# Patient Record
Sex: Male | Born: 1955 | Race: White | Marital: Married | Smoking: Former smoker
Health system: Northeastern US, Academic
[De-identification: ages and names within clinical notes are randomized; demographics above are authoritative.]

---

## 2003-11-11 ENCOUNTER — Other Ambulatory Visit: Payer: Self-pay

## 2005-05-31 ENCOUNTER — Other Ambulatory Visit: Payer: Self-pay

## 2009-01-19 ENCOUNTER — Ambulatory Visit
Admit: 2009-01-19 | Discharge: 2009-01-19 | Disposition: A | Discharge status: Home / Self Care | Payer: Self-pay | Source: Ambulatory Visit | Attending: Neurology | Admitting: Neurology

## 2009-05-25 ENCOUNTER — Ambulatory Visit: Payer: Self-pay | Admitting: Neurology

## 2009-05-25 NOTE — Progress Notes (Signed)
 Neurology - [facility] Note   Dear Dr. Adline Mango:     We had the pleasure of seeing your patient, Allen Cook, in the movement   disorders clinic today.  He is a 54 year old.  He is being seen in follow   up for cervical dystonia.  HPI   Allen Cook returned today for follow-up of his cervical dystonia.    He was last injected 4 mos and 1 week ago.      Onset: 7 days  Peak Effect:  He had total pain resolution. He also had almost total   resolution in his tremor.   Side Effect: none, no bleeding, no overweakness, no dysphagia  Duration: no wearing off as before with tightness of R posterior neck,   increasing tremor, no persistent pain.     MEDICATIONS:   Coumadin, lisinopril.      EXAMINATION:   Pain: current 0/10 last week 0/10  Inspection:   Hypertrophy: none  Tremor: He has intermittent, small amplitude chaotic tremor when looking   forward.      Dystonic Position:  Torticollis:  left 45 degrees  Tilt: 5 deg R   Flex/Ex: 5 deg Ext  Shift: He has right lateral shift 0.5 cm   Shoulders: L slight elevation      ROM: Full bilaterally     Impression: Cervical dystonia.      Procedure note: After discussion, we decided to proceed with further   botulinum toxin therapy.  EMG guidance was used to ensure proper   localization.  We attempted to maintain superficial injections.  225 units   were injected as follows:      Right:   Trapezius: 50 divided by 2.   Semispinalis capitis: 50 /2.      Left:   Levator: 50.   Splenius capitus: 50 divided by 2.   Semispinalis capitis, superiorly: 25      He tolerated the procedure well.  Hemostasis was obtained.  I will see him   in follow-up.  Current Meds   Coumadin 5 MG Tablet;; RPT  Lisinopril-Hydrochlorothiazide 20-12.5 MG Tablet;; RPT  Amoxicillin-Pot Clavulanate 875-125 MG Tablet;; RPT  FreeStyle Lite Test Strip;; RPT.  Signature   Electronically signed by: Rosalio Loud  M.D.,PhD; 05/25/2009 3:24 PM   EST.

## 2009-09-23 ENCOUNTER — Ambulatory Visit: Payer: Self-pay | Admitting: Neurology

## 2009-09-23 NOTE — Progress Notes (Signed)
Neurology - [facility] Note   Dear Dr. Adline Mango:     We had the pleasure of seeing your patient, Allen Cook, in the movement   disorders clinic today.  He is a 54 year old.  He is being seen in follow   up for cervical dystonia.  HPI   Allen Cook returned today for follow-up of his cervical dystonia.    He was last injected 4 mos and 0 week ago.      Onset: 7 days  Peak Effect:  Pain: total resolution; Tremor: minor and able to control   better.   Side Effect: none, no bleeding, no overweakness, no dysphagia  Duration: starting to get tight over right lateral neck over the last week   or so      MEDICATIONS:   Coumadin, lisinopril.      EXAMINATION:   Pain: current 0/10 last week 0/10  Inspection:   Hypertrophy: none  Tremor: He has intermittent, small amplitude chaotic tremor when looking   forward.      Dystonic Position:  Torticollis:  left 10 degrees  Tilt: 5 deg R   Flex/Ex: 5 deg Ext  Shift: He has right lateral shift 0.5 cm   Shoulders: L slight elevation      ROM: Full bilaterally     Impression: Cervical dystonia.      Procedure note: After discussion, we decided to proceed with further   botulinum toxin therapy.  EMG guidance was used to ensure proper   localization.  We attempted to maintain superficial injections.  225 units   were injected as follows:      Right:   Trapezius: 50 divided by 2.   Semispinalis capitis: 50 /2.      Left:   Levator: 50.   Splenius capitus: 50 divided by 2.   Semispinalis capitis, superiorly: 25      He tolerated the procedure well.  Hemostasis was obtained.  I will see him   in follow-up.  Current Meds   Lisinopril-Hydrochlorothiazide 20-12.5 MG Tablet;; RPT  Coumadin 5 MG Tablet;; RPT  Amoxicillin-Pot Clavulanate 875-125 MG Tablet;; RPT  FreeStyle Lite Test Strip;; RPT.  Signature   Electronically signed by: Rosalio Loud  M.D.,PhD; 09/23/2009 3:28 PM   EST.

## 2010-01-06 ENCOUNTER — Ambulatory Visit: Payer: Self-pay | Admitting: Neurology

## 2010-01-06 NOTE — Progress Notes (Signed)
 Neurology - [facility] Note   Dear Dr. Adline Mango:     We had the pleasure of seeing your patient, Allen Cook, in the movement   disorders clinic today.  He is a 54 year old.  He is being seen in follow   up for cervical dystonia.  HPI   [name] returned today for follow-up of his cervical dystonia.    He was last injected 3 mos and 2 week ago.      Onset: 10 days  Peak Effect:  Pain: improvement in aching in neck; Tremor: minor and able   to control better.   Side Effect: none, no bleeding, no overweakness, no dysphagia  Duration: starting to get tight over right lateral neck over the last week   or so      MEDICATIONS:   Coumadin, lisinopril.      EXAMINATION:   Pain: current 0/10 last week 0/10  Inspection:   Hypertrophy: none  Tremor: He has intermittent, small amplitude chaotic tremor when looking   forward.      Dystonic Position:  Torticollis:  left 10 degrees  Tilt: 5 deg R   Flex/Ex: 5 deg Ext  Shift: He has right lateral shift 1.0 cm   Shoulders: level     ROM: slight decrease to left     Impression: Cervical dystonia.      Procedure note: After discussion, we decided to proceed with further   botulinum toxin therapy.  EMG guidance was used to ensure proper   localization.  We attempted to maintain superficial injections.  225 units   were injected as follows:      Right:   Trapezius: 50 divided by 2.   Semispinalis capitis: 50 /2.      Left:   Levator: 50.   Splenius capitus: 50 divided by 2.   Semispinalis capitis, superiorly: 25      He tolerated the procedure well.  Hemostasis was obtained.  I will see him   in follow-up.  Current Meds   Lisinopril-Hydrochlorothiazide 20-12.5 MG Tablet;; RPT  Coumadin 5 MG Tablet;; RPT  Amoxicillin-Pot Clavulanate 875-125 MG Tablet;; RPT  FreeStyle Lite Test Strip;; RPT.  Signature   Electronically signed by: Rosalio Loud  M.D.,PhD; 01/06/2010 4:41 PM   EST.

## 2010-05-10 ENCOUNTER — Encounter: Payer: Self-pay | Admitting: Neurology

## 2010-05-10 ENCOUNTER — Ambulatory Visit: Payer: Self-pay | Admitting: Neurology

## 2010-05-10 NOTE — Progress Notes (Signed)
 Neurology - [facility] Note   Dear Dr. Adline Mango:     We had the pleasure of seeing your patient, Amyr Klopf, in the movement   disorders clinic today.  He is a 55 year old.  He is being seen in follow   up for cervical dystonia.  HPI   Allen Cook returned today for follow-up of his cervical dystonia.    He was last injected 4 mos and 0 week ago.      Onset: 10 days  Peak Effect:  did not work as well as it had in the past.   Side Effect: none, no bleeding, no overweakness, no dysphagia  Duration: effect over last month has been very decreased      MEDICATIONS:   Coumadin, lisinopril.      EXAMINATION:   Pain: current 0/10 last week 0/10  He feels tightness (achiness) bilaterally along paraspinal muscles of   inferior neck   Inspection:   Hypertrophy: none  Tremor: He has intermittent, small amplitude chaotic tremor when looking   forward.      Dystonic Position:  Torticollis:  left 10 degrees  Tilt: none   Flex/Ex: 5 deg Ext  Shift: He has right lateral shift 1.0 cm   Shoulders: level     ROM: R 60 deg, L 70 deg but both painful.     Impression: Cervical dystonia.      Procedure note: After discussion, we decided to proceed with further   botulinum toxin therapy.  EMG guidance was used to ensure proper   localization.  We switched back to the 26G 37mm needle (last time we   attempted a 30G, 25 mm needle but he felt he did not have the same effect)    225 units were injected as follows:      Right:   Trapezius: 25   Levator 25.   Semispinalis capitis: 50 /2.      Left:   Levator: 50.   Splenius capitus: 50 divided by 2.   Semispinalis capitis, superiorly: 25      He tolerated the procedure well.  Hemostasis was obtained.  I will see him   in follow-up.  Current Meds   Coumadin 5 MG Tablet;; RPT  Lisinopril-Hydrochlorothiazide 20-12.5 MG Tablet;; RPT  Amoxicillin-Pot Clavulanate 875-125 MG Tablet;; RPT  FreeStyle Lite Test Strip;; RPT.  Signature   Electronically signed by: Rosalio Loud  M.D.,PhD;  05/10/2010 3:46 PM   EST.

## 2010-09-06 ENCOUNTER — Ambulatory Visit: Payer: Self-pay | Admitting: Neurology

## 2010-09-06 DIAGNOSIS — G243 Spasmodic torticollis: Secondary | ICD-10-CM

## 2010-09-06 NOTE — Progress Notes (Signed)
 Allen Cook returned today for follow-up of his cervical dystonia.   He was last injected 4 mos and 0 week ago.     Onset: 10 days   Peak Effect: very well: stopped pain but tremor continued.   Side Effect:  no bleeding, no overweakness, no dysphagia   Duration: effect over last month has been very decreased     MEDICATIONS:   Coumadin, lisinopril.     EXAMINATION:   Pain: current 1/10    He feels  Stiff bilaterally along paraspinal muscles of inferior neck , relieved by letting head tilt back , slightly to R  Inspection:   Hypertrophy: none   Tremor: He has intermittent, small amplitude chaotic tremor when looking forward.     Dystonic Position:   Torticollis: left 10 degrees   Tilt: none   Flex/Ex:none  Shift: He has right lateral shift 1.0 cm   Shoulders: mild L elevation    ROM: R 60 deg,.     Impression: Cervical dystonia.     Procedure note: After discussion, we decided to proceed with further   botulinum toxin therapy. EMG guidance was used to ensure proper   localization. We switched back to the 26G 37mm needle (last time we attempted a 30G, 25 mm needle but he felt he did not have the same effect)   225 units were injected as follows:     Right:   Trapezius: 25   Levator 25.   Semispinalis capitis: 50 /2.     Left:   Levator: 50.   Splenius capitus: 50 divided by 2.   Semispinalis capitis, superiorly: 25     He tolerated the procedure well. Hemostasis was obtained. I will see him in follow-up.eg

## 2011-01-05 ENCOUNTER — Ambulatory Visit: Payer: Self-pay | Admitting: Neurology

## 2011-01-10 ENCOUNTER — Encounter: Payer: Self-pay | Admitting: Neurology

## 2011-01-10 ENCOUNTER — Ambulatory Visit: Payer: Self-pay | Admitting: Neurology

## 2011-01-10 DIAGNOSIS — I10 Essential (primary) hypertension: Secondary | ICD-10-CM | POA: Insufficient documentation

## 2011-01-10 DIAGNOSIS — G243 Spasmodic torticollis: Secondary | ICD-10-CM | POA: Insufficient documentation

## 2011-01-10 DIAGNOSIS — I82409 Acute embolism and thrombosis of unspecified deep veins of unspecified lower extremity: Secondary | ICD-10-CM | POA: Insufficient documentation

## 2011-01-10 NOTE — Patient Instructions (Signed)
You have just received botulinum toxin injections.  While these are generally well tolerated, you may experience bruising, or soreness.  Should you develop any significant bleeding or bruising, or pain, please do not hesitate to contact the office.  Likewise, if you experience any significant weakness, difficulty swallowing or difficulty breathing, or double vision please contact the office immediately and if severe, do not hesitate to call 911.

## 2011-01-10 NOTE — Progress Notes (Signed)
Mr. Allen Cook returned today for follow-up of his cervical dystonia.   He was last injected 4 mos and 0 week ago.     Onset: 1 wk  Peak Effect: very well: stopped pain but tremor continued.   Side Effect:  no bleeding, no overweakness, no dysphagia   Duration: effect over last month has been very decreased     MEDICATIONS:   Coumadin, lisinopril.     EXAMINATION:   Pain: current 0/10    He feels  Stiff bilaterally along paraspinal muscles of inferior neck , relieved by letting head tilt back , slightly to R  Inspection:   Hypertrophy: none   Tremor: He has persistent, small amplitude chaotic tremor when looking forward.     Dystonic Position:   Torticollis: left 5 degrees   Tilt: none   Flex/Ex:5 EX  Shift: He has right lateral shift 1.0 cm   Shoulders: level    ROM: R 60 deg,.  Time in Mid P: >60 sec     Impression: Cervical dystonia.     Procedure note: After discussion, we decided to proceed with further   botulinum toxin therapy. EMG guidance was used to ensure proper   localization.  26G 37mm needle   225 units were injected as follows:     Right:   Trapezius: 25   Levator 25.   Semispinalis capitis: 50 /2.     Left:   Levator: 50.   Splenius capitus: 50 divided by 2.   Semispinalis capitis, superiorly: 25     He tolerated the procedure well. Hemostasis was obtained. I will see him in follow-up.eg

## 2011-05-11 ENCOUNTER — Ambulatory Visit: Payer: Self-pay | Admitting: Neurology

## 2011-07-25 ENCOUNTER — Ambulatory Visit: Payer: Self-pay | Admitting: Neurology

## 2011-08-01 ENCOUNTER — Ambulatory Visit: Payer: Self-pay | Admitting: Neurology

## 2011-08-04 ENCOUNTER — Ambulatory Visit: Payer: Self-pay | Admitting: Neurology

## 2011-08-04 ENCOUNTER — Encounter: Payer: Self-pay | Admitting: Neurology

## 2011-08-04 DIAGNOSIS — G243 Spasmodic torticollis: Secondary | ICD-10-CM

## 2011-08-04 NOTE — Progress Notes (Signed)
Allen Cook returned today for follow-up of his cervical dystonia.   He was last injected 6 mos and 3 week ago.     Onset: 1 wk  Peak Effect: very well: stopped pain. Has felt excellent. Also attributes some benefit to increase exercise  Side Effect:  no bleeding, no overweakness, no dysphagia   Duration: effect over last month has been very decreased     MEDICATIONS:   Coumadin, lisinopril.     EXAMINATION:   Pain: current 0/10    He feels  Stiff bilaterally along paraspinal muscles of inferior neck , relieved by letting head tilt back , slightly to R  Inspection:   Hypertrophy: none   Tremor: He has persistent, small amplitude chaotic tremor when looking forward.     Dystonic Position:   Torticollis: left 15 degrees   Tilt: 5 R   Flex/Ex:15 EX  Shift: He has right lateral shift 1.0 cm   Shoulders: level    ROM: R 60 deg,.  Time in Mid P: >60 sec     Impression: Cervical dystonia.     Procedure note: After discussion, we decided to proceed with further   botulinum toxin therapy. EMG guidance was used to ensure proper   localization.  26G 37mm needle   225 units were injected as follows:     Right:   Trapezius: 25   Levator 25.   Semispinalis capitis: 50 /2.     Left:   Levator: 50.   Splenius capitus: 50 divided by 2.   Semispinalis capitis, superiorly: 25     He tolerated the procedure well. Hemostasis was obtained. I will see him in follow-up.

## 2012-02-01 ENCOUNTER — Ambulatory Visit: Payer: Self-pay | Admitting: Neurology

## 2012-02-01 ENCOUNTER — Encounter: Payer: Self-pay | Admitting: Neurology

## 2012-02-01 VITALS — BP 138/82 | HR 75 | Ht 71.0 in | Wt 248.0 lb

## 2012-02-01 DIAGNOSIS — G243 Spasmodic torticollis: Secondary | ICD-10-CM

## 2012-02-01 NOTE — Progress Notes (Signed)
Mr. Allen Cook returned today for follow-up of his cervical dystonia.   He was last injected 6 mos and 0 week ago.     Onset: 1 wk  Peak Effect: very well: stopped pain. Has felt excellent. Also attributes some benefit to increase exercise  Side Effect:  no bleeding, no overweakness, no dysphagia   Duration: mildly stiff, last month  Better when does exercises    MEDICATIONS:   Coumadin, lisinopril.     EXAMINATION:   Pain: current 0/10    He feels  Stiff bilaterally along paraspinal muscles of inferior neck , relieved by letting head tilt back , slightly to R  Inspection:   Hypertrophy: none   Tremor: intermittent, fast jerks, horizontal, small amplitude    Dystonic Position:   Torticollis: left 15 degrees   Tilt:  5 R   Flex/Ex:10 EX  Shift: He has right lateral shift 1.0 cm   Shoulders: level    ROM: R 60 deg,.  Time in Mid P: >60 sec     Impression: Cervical dystonia.     Procedure note: After discussion, we decided to proceed with further   botulinum toxin therapy. EMG guidance was used to ensure proper   localization.  26G 37mm needle   225 units were injected as follows:     Right:   Trapezius:   25   Levator   25.   Semispinalis capitis: 50 /2.     Left:   Levator:   50.   Splenius capitus:  50/ 2.   Semispin capitis,    superiorly:  25     He tolerated the procedure well. Hemostasis was obtained. I will see him in follow-up.

## 2012-08-13 ENCOUNTER — Encounter: Payer: Self-pay | Admitting: Neurology

## 2012-08-13 ENCOUNTER — Ambulatory Visit: Payer: Self-pay | Admitting: Neurology

## 2012-08-13 VITALS — BP 152/90 | HR 75 | Ht 71.0 in | Wt 252.0 lb

## 2012-08-13 DIAGNOSIS — G243 Spasmodic torticollis: Secondary | ICD-10-CM

## 2012-08-13 NOTE — Progress Notes (Signed)
Mr. Sosnoski returned today for follow-up of his cervical dystonia.   He was last injected 6 mos and 0 week ago.     Onset: 1 wk  Peak Effect: good, some problems , occasional, not bad- tightness while driving  Side Effect:  no bleeding, no overweakness, no dysphagia   Duration: doing well, mild symptomatic    MEDICATIONS:   Coumadin, lisinopril.     EXAMINATION:   Pain: current 0/10    He feels  Stiff bilaterally along paraspinal muscles of inferior neck , relieved by letting head tilt back , slightly to R  Inspection:   Hypertrophy: none   Tremor: intermittent, fast jerks, horizontal, small amplitude    Dystonic Position:   Torticollis: left 20 degrees   Tilt:  0  Flex/Ex:10 EX  Shift: He has right lateral shift 2.5 cm   Shoulders: level    ROM: R 60 deg,.  Time in Mid P:unable  Impression: Cervical dystonia.     Procedure note: After discussion, we decided to proceed with further   botulinum toxin therapy. EMG guidance was used to ensure proper   localization.  26G 37mm needle   225 units were injected as follows:     Right:   Trapezius:   25   Levator   25.   Semispinalis capitis: 50 /2.     Left:   Levator:   50.   Splenius capitus:  50/ 2.   Semispin capitis,    superiorly:  25     He tolerated the procedure well. Hemostasis was obtained. I will see him in follow-up.

## 2013-02-13 ENCOUNTER — Encounter: Payer: Self-pay | Admitting: Neurology

## 2013-02-13 ENCOUNTER — Ambulatory Visit: Payer: Self-pay | Admitting: Neurology

## 2013-02-13 VITALS — BP 148/92 | HR 84 | Ht 71.0 in | Wt 250.0 lb

## 2013-02-13 DIAGNOSIS — G243 Spasmodic torticollis: Secondary | ICD-10-CM

## 2013-02-13 NOTE — Progress Notes (Signed)
Allen Cook returned today for follow-up of his cervical dystonia.     He was last injected 6 mos and 0 week ago with good response.    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    EXAMINATION:   BP 148/92  Pulse 84  Ht 1.803 m (5\' 11" )  Wt 113.399 kg (250 lb)  BMI 34.88 kg/m2  Pain: mild posterior neck pain  Hypertrophy: none   Tremor: intermittent, high frequency, low amplitude    Dystonic Position:   Torticollis: left 20 degrees   Tilt: none  Flex/Ex:10 EX  Shift: right lateral shift  Shoulders: left elevation and anterior displacement    ROM: R about 60 deg with lateral flexion    Impression: Cervical dystonia.     Procedure note: After discussion, we decided to proceed with further botulinum toxin therapy. EMG guidance was used to ensure proper localization.  26G 37mm needle.  Given his complaints of left shoulder elevation/pain we adjusted the distribution of injections.  225 units were injected as follows:     Right:    02/13/13 08/13/12   Trapezius:    50/2  25   Levator    0  25.   Semispinalis capitis:  0  50 /2.   Semispinalis cervicis  25  0    Left:   Levator:    75/2  50.   Splenius capitus:  50/2  50/2.   Semispin capitis,    superiorly:   25  25     He tolerated the procedure well. Hemostasis was obtained. I will see him in follow-up in about 6 months.    Vicenta Aly, MD,PHD

## 2013-08-05 ENCOUNTER — Ambulatory Visit: Payer: Self-pay | Admitting: Neurology

## 2013-08-05 ENCOUNTER — Encounter: Payer: Self-pay | Admitting: Neurology

## 2013-08-05 VITALS — BP 147/87 | HR 91 | Ht 71.5 in | Wt 253.0 lb

## 2013-08-05 DIAGNOSIS — G243 Spasmodic torticollis: Secondary | ICD-10-CM

## 2013-08-05 NOTE — Progress Notes (Signed)
Mr. Allen Cook returned today for follow-up of his cervical dystonia.     He was last injected 6 mos and 0 week ago with good response.    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    EXAMINATION:   BP 147/87    Pulse 91    Ht 1.816 m (5' 11.5")    Wt 114.76 kg (253 lb)    BMI 34.80 kg/m2        Pain: mild posterior neck pain  Hypertrophy: right SCM   Tremor: none  Dystonic Position:   Torticollis: left 20 degrees   Tilt: none  Flex/Ex:10 EX  Shift: right lateral shift  Shoulders: 2+ left elevation and anterior displacement    ROM: R about 60 deg with lateral flexion    Impression: Cervical dystonia.     Procedure note: After discussion, we decided to proceed with further botulinum toxin therapy. EMG guidance was used to ensure proper localization.  26G 37mm needle.  Given his complaints of left shoulder elevation/pain we adjusted the distribution of injections.  275 units were injected as follows:     Right:    5/12  02/13/13 08/13/12   Trapezius:    50/2  50/2  25   Levator    0  0  25.   Semispinalis capitis:  0  0  50 /2.   Semispinalis cervicis  25  25  0    Left:   Levator:    75/3  75/2  50.   Splenius capitus:  50/2  50/2  50/2.   Semispin capitis,    superiorly:   50/2  25  25    Trapezius   25  X  X     25 d/c as end of vial.  Total 300  He tolerated the procedure well. Hemostasis was obtained. I will see him in follow-up in about 6.5 months.    Allen AlyICHARD L Demiyah Fischbach, MD,PHD

## 2013-08-12 ENCOUNTER — Ambulatory Visit: Payer: Self-pay | Admitting: Neurology

## 2014-02-26 ENCOUNTER — Encounter: Payer: Self-pay | Admitting: Neurology

## 2014-02-26 ENCOUNTER — Ambulatory Visit: Payer: Self-pay | Admitting: Neurology

## 2014-02-26 VITALS — BP 159/91 | HR 89 | Ht 71.5 in | Wt 247.0 lb

## 2014-02-26 DIAGNOSIS — G243 Spasmodic torticollis: Secondary | ICD-10-CM

## 2014-02-26 MED ORDER — ONABOTULINUMTOXINA 100 UNIT IJ SOLR *I*
300.0000 [IU] | Freq: Once | INTRAMUSCULAR | Status: AC
Start: 2014-02-26 — End: 2014-02-26
  Administered 2014-02-26: 300 [IU] via INTRAMUSCULAR

## 2014-02-26 NOTE — Progress Notes (Signed)
Mr. Vale HavenDavey returned today for follow-up of his cervical dystonia.     He was last injected 6 mos and 3 weeks ago on 08/05/13 with very good response. He says injections lasted longer and he had improvement in left shoulder elevation in pain (injections had been altered given this complaint--additional 25 units to left semispinalis capitis superiorly).    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    EXAMINATION:   BP 159/91 mmHg   Pulse 89   Ht 1.816 m (5' 11.5")   Wt 112.038 kg (247 lb)   BMI 33.97 kg/m2     Pain: mild posterior neck pain  Hypertrophy: right SCM   Tremor: mild irregular yes-yes  Dystonic Position:   Torticollis: left 20 degrees   Tilt: none  Flex/Ex:10 EX  Shift: mild right lateral shift  Shoulders: 2+ left elevation and anterior displacement    ROM: R about 60 deg with lateral flexion, good rotation    Impression: Cervical dystonia.     Procedure note: After discussion, we decided to proceed with further botulinum toxin therapy.    A total of 300 units of ONAbotulinum toxin A (BOTOX) was reconstituted.    EMG guidance was used to ensure proper localization.  26G 37mm needle.     275 units were injected as follows:      02/26/14 08/05/13 02/13/13 08/13/12   RIGHT       Trapezius 50/2 50/2 50/2 25   Levator 0 0 0 25   Semispinalis capitis 0 0 0 50/2   Semispinalis cervicis 25 25 25  0          LEFT       Levator 75/3 75/3 75/2 50   Splenius capitis 50/2 50/2 50/2 50/2   Semispinalis capitis, superiorly 50/2 50/2 25 25    Trapezius 25 25 0 0     25 units were discarded as unavoidable waste. Total 300 units.    He tolerated the procedure well. Hemostasis was obtained.    Injections were performed by Dr. Pernell DupreAdams under the direct supervision  of Dr. Satira MccallumBarbano.    He will return for follow-up in about 6.5 months for consideration of repeat injections.    The patient was seen and evaluated by me with the fellow.  The plan of care was developed with me and approved by me.  All injections were either performed by me personally or under my direct supervision.

## 2014-07-29 ENCOUNTER — Telehealth: Payer: Self-pay

## 2014-07-29 NOTE — Telephone Encounter (Signed)
PER LEE AT [facility] ([phone]), NO AUTHORIZATION NEEDED FOR BOTOX UNDER PT'S PLAN.

## 2014-08-25 ENCOUNTER — Ambulatory Visit: Payer: Self-pay | Admitting: Neurology

## 2014-09-15 ENCOUNTER — Ambulatory Visit: Payer: Self-pay | Admitting: Neurology

## 2014-09-15 ENCOUNTER — Encounter: Payer: Self-pay | Admitting: Neurology

## 2014-09-15 VITALS — BP 140/82 | HR 91 | Ht 71.0 in | Wt 252.0 lb

## 2014-09-15 DIAGNOSIS — G243 Spasmodic torticollis: Secondary | ICD-10-CM

## 2014-09-15 MED ORDER — ONABOTULINUMTOXINA 100 UNIT IJ SOLR *I*
300.0000 [IU] | Freq: Once | INTRAMUSCULAR | Status: AC
Start: 2014-09-15 — End: 2014-09-15
  Administered 2014-09-15: 300 [IU] via INTRAMUSCULAR

## 2014-09-15 NOTE — Progress Notes (Signed)
Mr. Allen Cook returned today for follow-up of his cervical dystonia.     He was last injected about  6 months and 2 weeks ago on 02/26/14.  The injections went very well.  He feels like the injections are lasting longer.    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    EXAMINATION:   Visit Vitals    BP 140/82 (BP Location: Left arm, Patient Position: Sitting, Cuff Size: large adult)    Pulse 91    Ht 1.803 m (5\' 11" )    Wt 114.3 kg (252 lb)    BMI 35.15 kg/m2      Pain: mild posterior right neck pain  Hypertrophy: right SCM   Tremor: very mild irregular yes-yes  Dystonic Position:   Torticollis: left 20 degrees   Tilt: none  Flex/Ex:10 EX  Shift: mild right lateral shift  Shoulders: 2+ left elevation and anterior displacement    ROM: good rotation    Impression: Cervical dystonia with excellent response to botulinum toxin injections.  After discussion, we decided to proceed with botulinum toxin injections.  He will return for follow-up in about 6.5 months for consideration of repeat injections.    Procedure note: After discussion, we decided to proceed with further botulinum toxin therapy.    A total of 300 units of ONAbotulinum toxin A (BOTOX) was freshly reconstituted.    EMG guidance was used to ensure proper localization.  26G 86mm needle.     275 units were injected as follows:      09/15/14 02/26/14 08/05/13 02/13/13 08/13/12   RIGHT        Trapezius 50/2 50/2 50/2 50/2 25   Levator 0 0 0 0 25   Semispinalis capitis 0 0 0 0 50/2   Semispinalis cervicis 25 25 25 25  0           LEFT        Levator 75/3 75/3 75/3 75/2 50   Splenius capitis 50/2 50/2 50/2 50/2 50/2   Semispinalis capitis, superiorly 50/2 50/2 50/2 25 25    Trapezius 25 25 25  0 0      25 units were discarded as unavoidable waste. Total 300 units.    He tolerated the procedure well. Hemostasis was obtained.    Injections were performed under the direct supervision of attending physician, Dr. Rosalio Loud.    Duanne Guess, MD  Movement Disorders Fellow

## 2015-02-23 ENCOUNTER — Ambulatory Visit: Payer: Self-pay | Admitting: Neurology

## 2015-02-23 ENCOUNTER — Encounter: Payer: Self-pay | Admitting: Neurology

## 2015-02-23 VITALS — BP 154/89 | HR 93 | Ht 71.0 in | Wt 249.0 lb

## 2015-02-23 DIAGNOSIS — G243 Spasmodic torticollis: Secondary | ICD-10-CM

## 2015-02-23 MED ORDER — ONABOTULINUMTOXINA 100 UNIT IJ SOLR *I*
300.0000 [IU] | Freq: Once | INTRAMUSCULAR | Status: AC
Start: 2015-02-23 — End: 2015-02-23
  Administered 2015-02-23: 300 [IU] via INTRAMUSCULAR

## 2015-02-23 NOTE — Progress Notes (Signed)
Mr. Vale HavenDavey returned today for follow-up of his cervical dystonia.     He was last injected about  5 months and 1 weeks ago .    The injections went very well.  He feels like the injections are lasting longer.    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    EXAMINATION:   Visit Vitals    BP 154/89 (BP Location: Right arm, Patient Position: Sitting, Cuff Size: adult)    Pulse 93    Ht 1.803 m (5\' 11" )    Wt 112.9 kg (249 lb)    BMI 34.73 kg/m2       Pain: mild posterior right neck pain  Hypertrophy: right SCM   Tremor: very mild irregular yes-yes  Dystonic Position:   Torticollis: left 20 degrees   Tilt: none  Flex/Ex:10 EX  Shift: mild right lateral shift  Shoulders: level    ROM: good rotation    Impression: Cervical dystonia with excellent response to botulinum toxin injections.  After discussion, we decided to proceed with botulinum toxin injections.  He will return for follow-up in about 6.5 months for consideration of repeat injections.    Procedure note: After discussion, we decided to proceed with further botulinum toxin therapy.    A total of 300 units of ONAbotulinum toxin A (BOTOX) was freshly reconstituted.    EMG guidance was used to ensure proper localization.  26G 37mm needle.     275 units were injected as follows:      11/29 09/15/14 02/26/14 08/05/13 02/13/13 08/13/12   RIGHT         Trapezius 50/2 50/2 50/2 50/2 50/2 25   Levator 0 0 0 0 0 25   Semispinalis capitis 0 0 0 0 0 50/2   Semispinalis cervicis 25 25 25 25 25  0            LEFT         Levator 75/3 75/3 75/3 75/3 75/2 50   Splenius capitis 50/2 50/2 50/2 50/2 50/2 50/2   Semispinalis capitis, superiorly 50/2 50/2 50/2 50/2 25 25    Trapezius 25 25 25 25  0 0     25 units were discarded as  unavoidable waste. Total 300 units.    He tolerated the procedure well. Hemostasis was obtained.    Vicenta AlyICHARD L Venesha Petraitis, MD,PHD

## 2015-08-06 ENCOUNTER — Telehealth: Payer: Self-pay

## 2015-08-06 NOTE — Telephone Encounter (Signed)
PER CASSANDRA AT [facility] [phone]), THERE HAS BEEN A CHANGE IN PATIENT'S POLICY & AUTHORIZATION IS NOW NEEDED FOR BOTOX.  FAXED BOTOX AUTHORIZATION REQUEST FORM WITH 09/15/14 & 02/23/15 MD NOTES TO [facility] AT [phone].  MARKED REQUEST URGENT PER CASSANDRA'S INSTRUCTIONS.

## 2015-08-10 ENCOUNTER — Ambulatory Visit: Payer: Self-pay | Admitting: Neurology

## 2015-08-10 ENCOUNTER — Encounter: Payer: Self-pay | Admitting: Neurology

## 2015-08-10 VITALS — BP 145/80 | HR 95 | Ht 71.0 in | Wt 248.0 lb

## 2015-08-10 DIAGNOSIS — G243 Spasmodic torticollis: Secondary | ICD-10-CM

## 2015-08-10 MED ORDER — ONABOTULINUMTOXINA 100 UNIT IJ SOLR *I*
300.0000 [IU] | Freq: Once | INTRAMUSCULAR | Status: AC
Start: 2015-08-10 — End: 2015-08-10
  Administered 2015-08-10: 275 [IU] via INTRAMUSCULAR

## 2015-08-10 NOTE — Progress Notes (Addendum)
Mr. Allen Cook returned today for follow-up of his cervical dystonia.     He was last injected about 5 months and 3 weeks ago.    The injections went very well.  He feels like the injections are lasting longer.    Onset: about 5 days  Peak Effect: very good response,would like a repeat of last  Side Effect:  None, no over-weakness or difficulty swallowing  Duration:  Thinks its still lasting with only mild right sided tightness in back of neck.  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    EXAMINATION:   Visit Vitals    BP 145/80    Pulse 95    Ht 1.803 m (5\' 11" )    Wt 112.5 kg (248 lb)    BMI 34.59 kg/m2     Pain: mild posterior right neck pain  Hypertrophy: right SCM   Tremor: very mild irregular yes-yes  Dystonic Position:   Torticollis: left 20 degrees   Tilt: none  Flex/Ex:10 EX  Shift: mild right lateral shift  Shoulders: level    ROM: good rotation    Impression: Cervical dystonia with excellent response to botulinum toxin injections.  After discussion, we decided to proceed with botulinum toxin injections.  He will return for follow-up in about 5 months for consideration of repeat injections.  [place] planning [place] retiring this year [place]     Procedure note: After discussion, we decided to proceed with further botulinum toxin therapy.    A total of 300 units of ONAbotulinum toxin A (BOTOX) was freshly reconstituted.    EMG guidance was used to ensure proper localization.  26G 37mm needle.     275 units were injected as follows:      08/10/15 11/29 09/15/14 02/26/14 08/05/13 02/13/13 08/13/12   RIGHT          Trapezius 50/2 50/2 50/2 50/2 50/2 50/2 25   Levator 0 0 0 0 0 0 25   Semispinalis capitis 0 0 0 0 0 0 50/2   Semispinalis cervicis 25 25 25 25 25 25  0             LEFT          Levator 75/3 75/3 75/3 75/3 75/3 75/2 50   Splenius capitis 50/2 50/2 50/2 50/2 50/2 50/2 50/2   Semispinalis capitis, superiorly 50/2 50/2 50/2 50/2 50/2 25 25     Trapezius 25 25 25 25 25  0 0     25 units were discarded as unavoidable waste.     He tolerated the procedure well. Hemostasis was obtained.    The injections were done by Dr Jon BillingsMorrison, supervised    Allen ArthursPeter Morrison, DO  Movement Disorders Fellow  The patient was seen and evaluated by me with the fellow.  The plan of care was developed with me and approved by me.  All injections were either performed by me personally or under my direct supervision.  Allen AlyICHARD L Allen Dike, MD,PHD

## 2015-12-16 ENCOUNTER — Telehealth: Payer: Self-pay

## 2015-12-16 NOTE — Telephone Encounter (Signed)
CALLED [facility] [phone]) RE BOTOX AUTHORIZATION FOR 12/28/15 APPT.  SPOKE WITH TAMMY WHO SAID AUTH ON FILE, AUTH [id number], EFFECTIVE 08/11/15-08/10/16, 4 VISITS.

## 2015-12-28 ENCOUNTER — Ambulatory Visit: Payer: Self-pay | Admitting: Neurology

## 2015-12-28 ENCOUNTER — Encounter: Payer: Self-pay | Admitting: Neurology

## 2015-12-28 VITALS — BP 157/83 | HR 78 | Ht 71.0 in | Wt 248.0 lb

## 2015-12-28 DIAGNOSIS — G243 Spasmodic torticollis: Secondary | ICD-10-CM

## 2015-12-28 MED ORDER — ONABOTULINUMTOXINA 100 UNIT IJ SOLR *I*
300.0000 [IU] | Freq: Once | INTRAMUSCULAR | Status: AC
Start: 2015-12-28 — End: 2015-12-28
  Administered 2015-12-28: 275 [IU] via INTRAMUSCULAR

## 2015-12-28 NOTE — Progress Notes (Signed)
Mr. Allen Cook returned today for follow-up of his Cervical Dystonia.     He was last injected about 4 months and 3 weeks ago.    The injections went very well. He denies adverse effects such as dysphagia or over-weakness. He feels like his left should is getting higher. He continues to feel like the injections are lasting longer.    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    EXAMINATION:   There were no vitals taken for this visit.  Pain: none  Hypertrophy: right SCM   Tremor:  Mild, irregular  Dystonic Position:   Torticollis: left 20 degrees   Tilt: slight rightward  Flex/Ex:10 EX  Shift: mild right lateral shift  Shoulders: mild elevation on the left  Good ROM    Impression: Cervical dystonia with excellent response to botulinum toxin injections.  After discussion, we decided to proceed with botulinum toxin injections. We have made some changes to the disbribution of botox injections based on his complaint of left shoulder elevation and the degree of EMG muscle activity. He will return for follow-up in about 5 months for consideration of repeat injections.    Procedure note: After discussion, we decided to proceed with further botulinum toxin therapy.    A total of 300 units of ONAbotulinum toxin A (BOTOX) was freshly reconstituted.    EMG guidance was used to ensure proper localization.  26G 37mm needle.     275 units were injected as follows:      12/28/15 08/10/15 11/29 09/15/14 02/26/14 08/05/13 02/13/13 08/13/12   RIGHT           Trapezius 50/2 50/2 50/2 50/2 50/2 50/2 50/2 25   Levator 0 0 0 0 0 0 0 25   Semispinalis capitis 0 0 0 0 0 0 0 50/2   Semispinalis cervicis 25 25 25 25 25 25 25  0              LEFT           Levator 100/4 75/3 75/3 75/3 75/3 75/3 75/2 50   Splenius capitis 50/2 50/2 50/2 50/2 50/2 50/2 50/2 50/2    Scalene, anterior 25          Semispinalis capitis, superiorly 0 50/2 50/2 50/2 50/2 50/2 25 25    Trapezius 25 25 25 25 25 25  0 0     25 units were discarded as unavoidable waste.     He tolerated the procedure well. Hemostasis was obtained.    Allen PollackBhavpreet Azayla Polo, MD  Movement Disorders Fellow    The patient was seen and evaluated by me with the fellow.  The plan of care was developed with me and approved by me.  All injections were either performed by me personally or under my direct supervision.  Allen Loudichard Barbano, MD

## 2016-05-30 ENCOUNTER — Ambulatory Visit: Payer: Self-pay | Admitting: Neurology

## 2016-10-18 ENCOUNTER — Telehealth: Payer: Self-pay | Admitting: Neurology

## 2016-10-18 NOTE — Telephone Encounter (Signed)
Patient needs PA for Botox.

## 2016-11-15 ENCOUNTER — Telehealth: Payer: Self-pay | Admitting: Neurology

## 2016-11-15 NOTE — Telephone Encounter (Signed)
Pt moving to [facility] which is just [place] on the [address]. Pt has about 6 names he has gotten from the [employer]. He will email them to me and I will give this to Dr. Satira Mccallum so he can say whether he gives a thumbs up to any of them or has another suggestion. Myer Haff, RN

## 2016-11-15 NOTE — Telephone Encounter (Signed)
Patient stated he moved to [facility] and Is looking to see if Dr.Barbano has any recommendations on Neurologists down there.

## 2016-11-16 NOTE — Telephone Encounter (Signed)
After consulting with Dr Satira Mccallum informed pt that Allen Landau, MD is his only recommendation on the list. If Dr. Alben Deeds can't take him then maybe he could recommend someone for pt. Pt will comply and he thanks Dr. Satira Mccallum for his help. Myer Haff, RN

## 2016-12-14 ENCOUNTER — Telehealth: Payer: Self-pay | Admitting: Neurology

## 2016-12-14 NOTE — Telephone Encounter (Signed)
Patient calling stating he will be in town on Oct 1,2, and 3 and is wondering if there is the possibility of Dr. Satira Mccallum opening up a slot do perform patients botox. Please advise.

## 2016-12-19 NOTE — Telephone Encounter (Signed)
Patient's last botox was Oct 2017 and is scheduled for a botox on 10/2 at 9:30. Please advise if patient needs a prior auth for botox.

## 2016-12-26 ENCOUNTER — Ambulatory Visit: Payer: Commercial Managed Care - PPO | Attending: Neurology | Admitting: Neurology

## 2016-12-26 ENCOUNTER — Encounter: Payer: Self-pay | Admitting: Neurology

## 2016-12-26 VITALS — BP 161/86 | HR 86 | Ht 71.0 in | Wt 246.1 lb

## 2016-12-26 DIAGNOSIS — G243 Spasmodic torticollis: Secondary | ICD-10-CM | POA: Insufficient documentation

## 2016-12-26 MED ORDER — ONABOTULINUMTOXINA 100 UNIT IJ SOLR *I*
300.0000 [IU] | Freq: Once | INTRAMUSCULAR | Status: AC
Start: 2016-12-26 — End: 2016-12-26
  Administered 2016-12-26: 275 [IU] via INTRAMUSCULAR

## 2016-12-26 NOTE — Progress Notes (Addendum)
Mr. Allen Cook returned today for follow-up of his Cervical Dystonia.     He was last injected about 1 year ago. The injections went very well. We moved some of the left sided muscles at last injections. He denies adverse effects such as dysphagia or over-weakness. He feels this was more effective than previous injections, with benefit lasting 8 months. He returns now as tremor and right neck tightness have become more bothersome.       Onset: about 1 to 1.5 weeks  Peak Effect: very good response  Side Effect:  None, no over-weakness or difficulty swallowing  Duration: 8 months before wearing off.     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    EXAMINATION:   BP 161/86 (BP Location: Right arm, Patient Position: Sitting, Cuff Size: adult)   Pulse 86   Ht 1.803 m ( )   Wt 111.6 kg (246 lb 1.6 oz)   BMI 34.32 kg/m2  Pain: none  Hypertrophy: right SCM   Tremor:  Mild, irregular  Dystonic Position:   Torticollis: left 20 degrees   Tilt: slight rightward  Flex/Ex:10 EX  Shift: Moderate right lateral shift and anterior shift  Shoulders: mild elevation on the left  Good ROM    Impression: Cervical dystonia with excellent response to botulinum toxin injections.  After discussion, we decided to proceed with botulinum toxin injections. We have made some changes to the disbribution of botox injections based on his complaint of left shoulder elevation and the degree of EMG muscle activity. He will return for follow-up in about 6 months for consideration of repeat injections.    Procedure note: After discussion, we decided to proceed with further botulinum toxin therapy.    A total of 300 units of ONAbotulinum toxin A (BOTOX) was freshly reconstituted.    EMG guidance was used to ensure proper localization.  26G 37mm needle.     275 units were injected as follows:      12/26/16 12/28/15 08/10/15 11/29 09/15/14 02/26/14 08/05/13 02/13/13 08/13/12   RIGHT            Trapezius 50/2 50/2 50/2 50/2 50/2 50/2  50/2 50/2 25   Levator 0 0 0 0 0 0 0 0 25   Semispinalis capitis 0 0 0 0 0 0 0 0 50/2   Semispinalis cervicis 0               LEFT            Levator 100/4 100/4 75/3 75/3 75/3 75/3 75/3 75/2 50   Splenius capitis 50/2 50/2 50/2 50/2 50/2 50/2 50/2 50/2 50/2   Scalene, anterior 25 25          Semispinalis capitis, superiorly 0 0 50/2 50/2 50/2 50/2 50/2 25 25    Trapezius 0 0     25 units were discarded as unavoidable waste.     He tolerated the procedure well. Hemostasis was obtained.    Injections were performed under the direct supervision of Dr. Rosalio Loud, Movement Disorders Attending    Kathaleen Bury MD,  Movement Disorders Fellow  The patient was seen and evaluated by me with the fellow.  The plan of care was developed with me and approved by me.  All injections were either performed by me personally or under my direct supervision.  Vicenta Aly, MD,PHD

## 2017-07-03 ENCOUNTER — Ambulatory Visit: Payer: Commercial Managed Care - PPO | Admitting: Neurology

## 2017-11-05 ENCOUNTER — Telehealth: Payer: Self-pay | Admitting: Neurology

## 2017-11-05 NOTE — Telephone Encounter (Signed)
Called patient and advised of botox amount (300 units per last note) and CPT code [phone], also advised of administration code of [id number]. Confirmed that next appointment is on 11/26 and that prior auth team will look to obtain that auth in the beginning of November. Patient is all set.

## 2017-11-05 NOTE — Telephone Encounter (Signed)
Patient called stating that he needs a prior authorization for Botox. Patient also spoke to billing estimation and they asked him for a CPT code and milligram amount for his estimation. Please advise on PA and this information for patient. He would like a call back with updates at [phone].

## 2018-01-01 ENCOUNTER — Encounter: Payer: Self-pay | Admitting: Neurology

## 2018-02-19 ENCOUNTER — Ambulatory Visit: Payer: Commercial Managed Care - PPO | Admitting: Neurology

## 2019-11-11 IMAGING — DX LUMBAR SPINE 2 VIEW
1 series · 2 of 2 positions shown · non-contrast
Comparison: None

LUMBAR SPINE 2 VIEW, 11/11/2019 [DATE]: 
CLINICAL INDICATION: Low back pain x2 weeks

[Series 1: AP · 0.14mm/px · 2 of 2 slices shown]
[im 1/2]
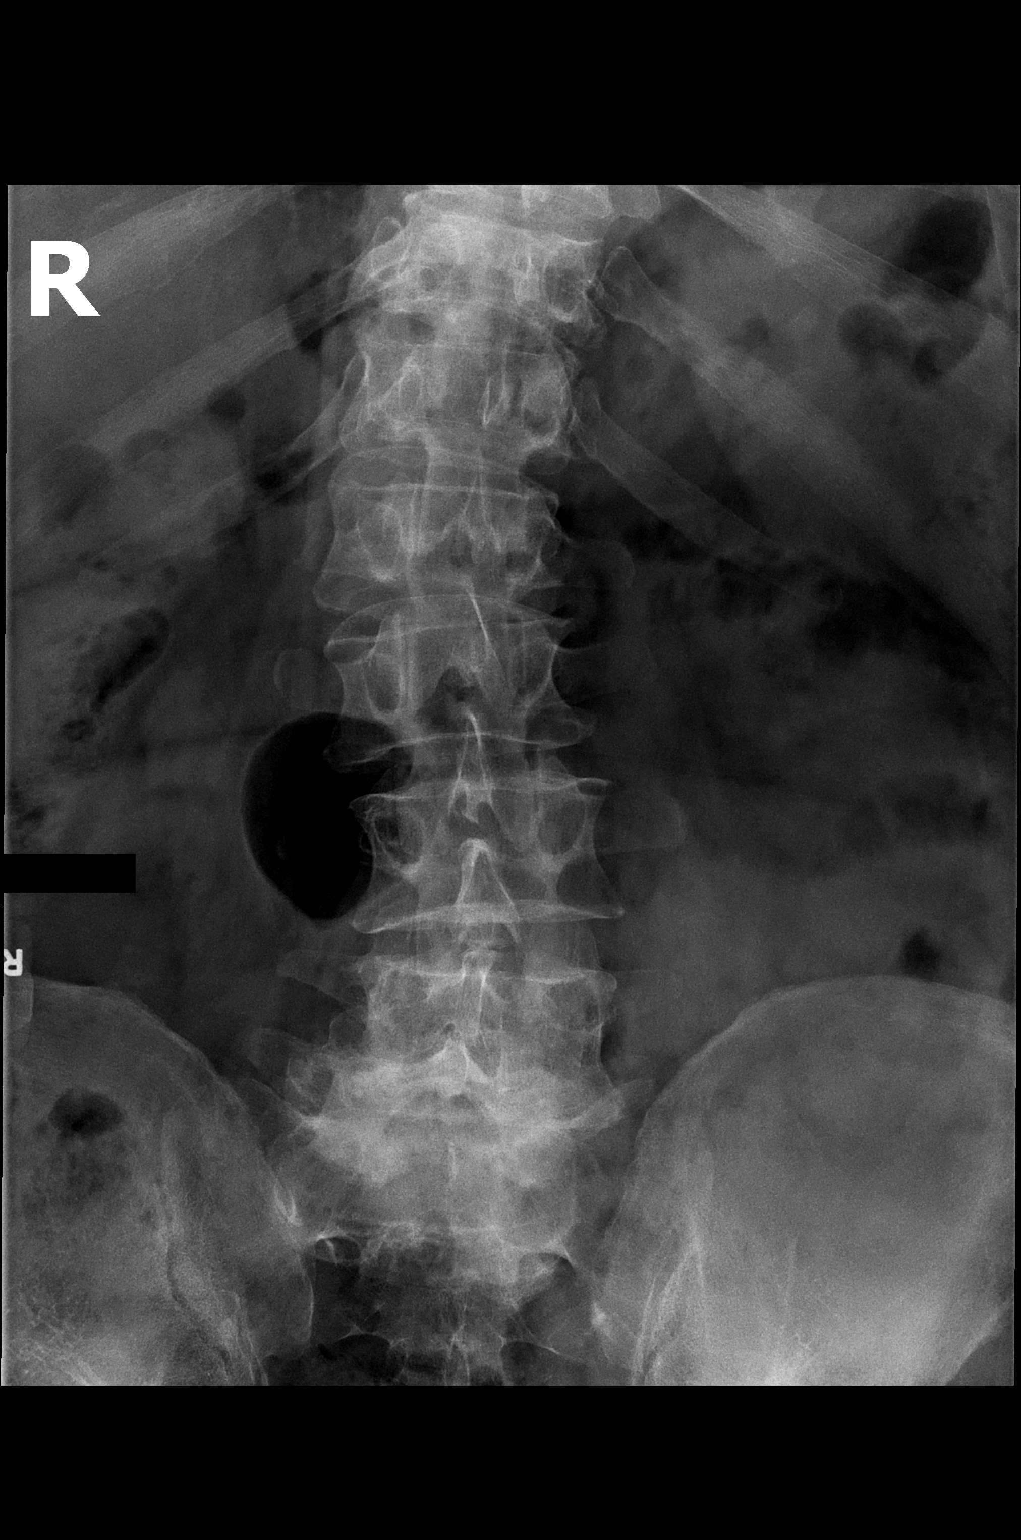
[im 2/2]
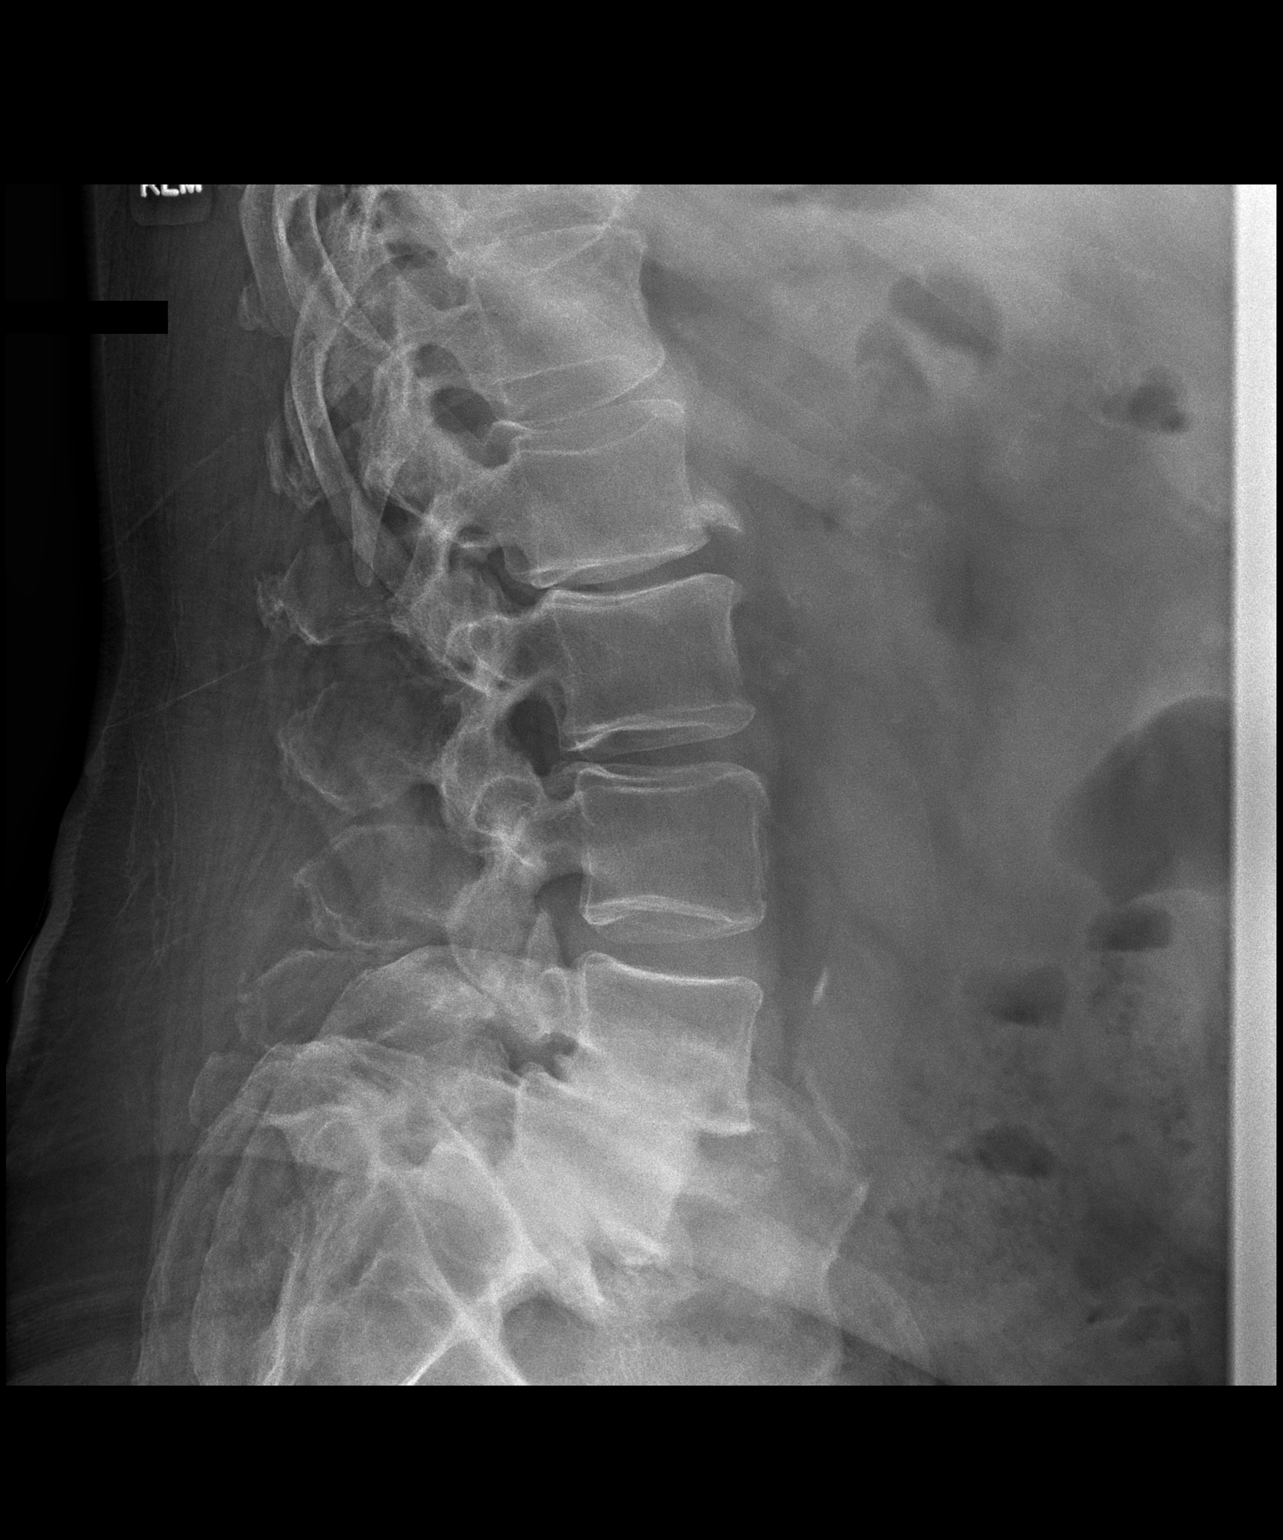

[2 of 2 positions shown; findings below may reference images not displayed]

FINDINGS: Lumbar vertebral heights are intact. There is grade 1 anterolisthesis of L4 on 
L5. There is approximately grade 2 anterolisthesis of L5 on S1 with bilateral L4 
and L5 pars defects. There is marked disc narrowing at L4-5 and L5-S1. There is 
facet degenerative changes. There is mild S-shaped lumbar scoliosis. There is 
moderate SI joint degenerative change.
IMPRESSION: Grade 2 anterolisthesis at L5-S1. Grade 1 anterolisthesis at L4-5. There are 
bilateral pars defects at these levels. There is likely underlying canal and/or 
foraminal stenosis. MRI would be useful for further evaluation.

## 2019-12-10 IMAGING — MR MRI LUMBAR SPINE WITHOUT CONTRAST
4 of 7 series · 28 of 48 positions shown · IV contrast (gadolinium)
Comparison: Lumbar radiograph November 11, 2019

MRI LUMBAR SPINE WITHOUT CONTRAST, 12/10/2019 [DATE]: 
CLINICAL INDICATION: Low back pain
TECHNIQUE: Sagittal T1, Sagittal T2, Sagittal STIR, Axial T1 and Axial T2 MR 
images of the lumbar spine were performed without intravenous gadolinium 
enhancement.

[Series 101: survey · axial · 10.0mm · 1.39mm/px · z∈[-15,+199]mm · 5 of 14 slices shown]
[im 1/14]
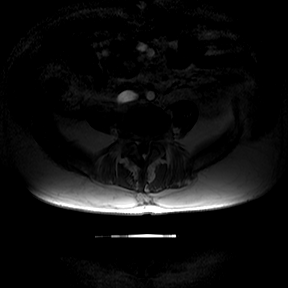
[im 4/14]
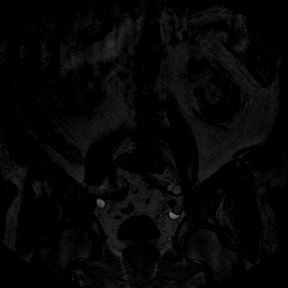
[im 7/14]
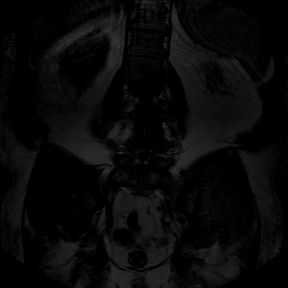
[im 10/14]
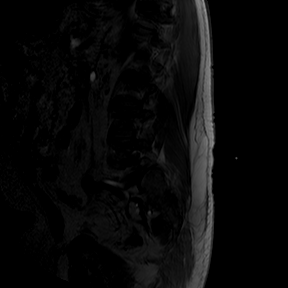
[im 14/14]
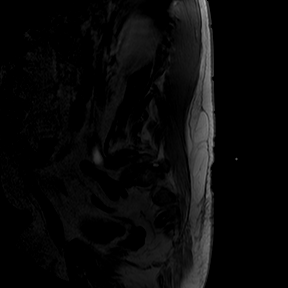

[Series 601: T2 · axial · 4.0mm · 0.47mm/px · z∈[-73,+169]mm · 8 of 33 slices shown (1 of 2)]
[im 1/33]
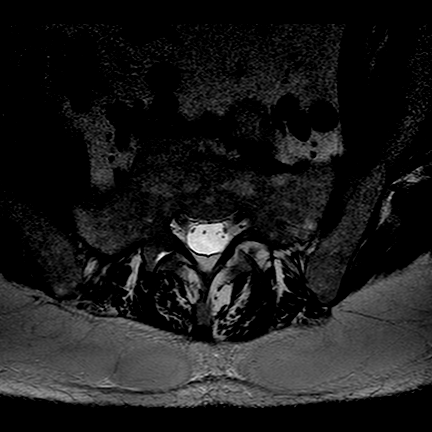
[im 4/33]
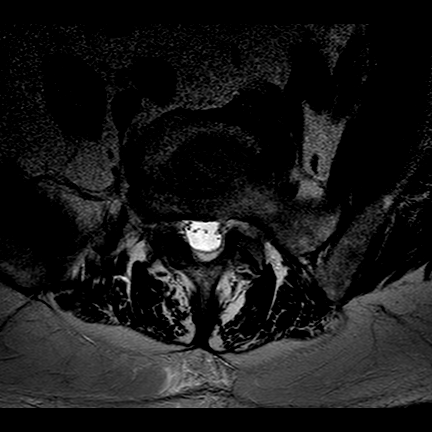
[im 11/33]
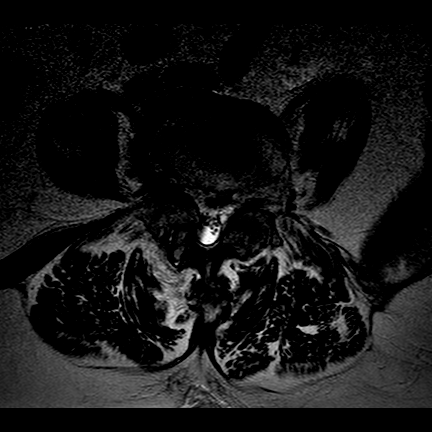
[im 15/33]
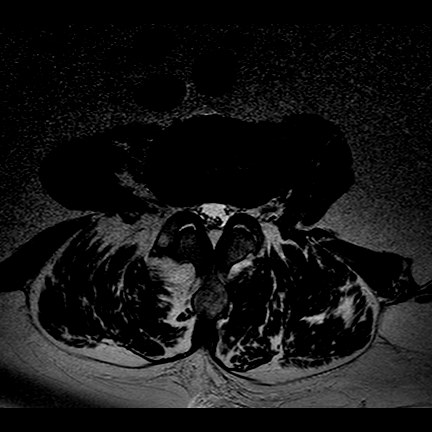
[im 18/33]
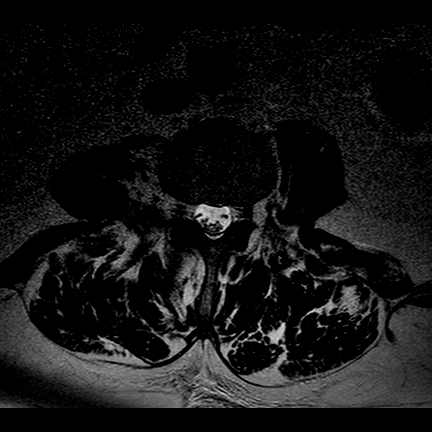
[im 22/33]
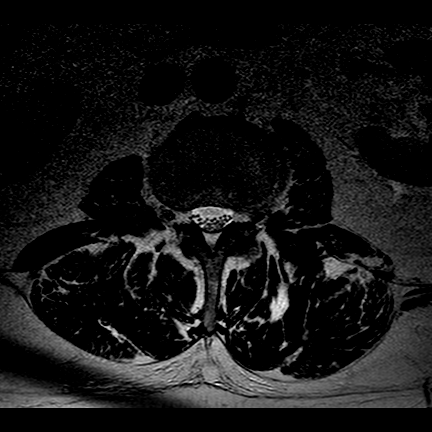
[im 29/33]
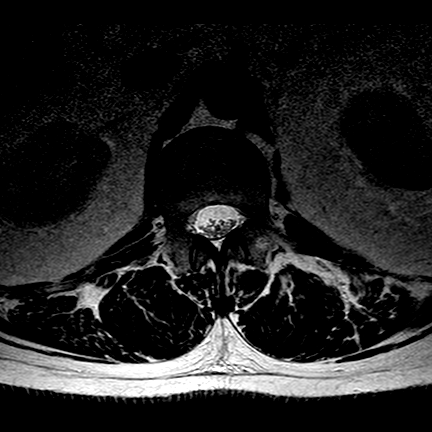
[im 33/33]
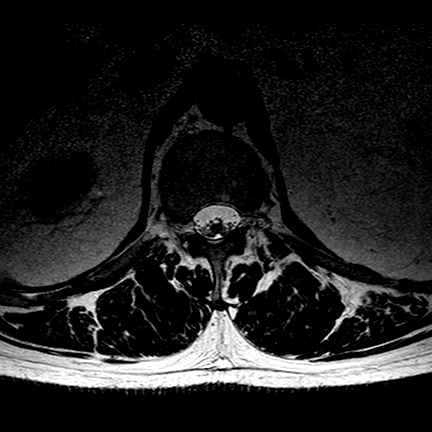

[Series 701: T1 · axial · 4.0mm · 0.35mm/px · z∈[-68,+169]mm · 9 of 40 slices shown]
[im 1/40]
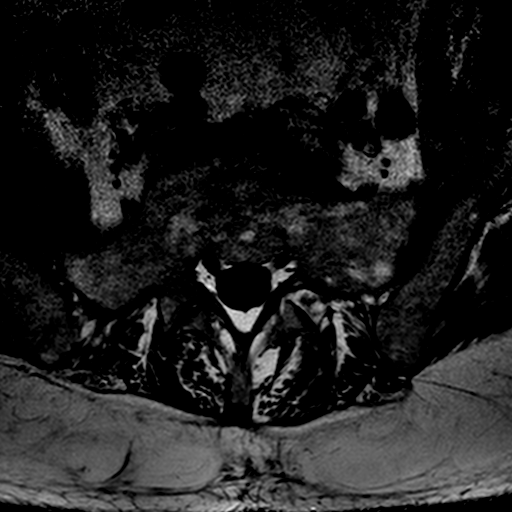
[im 8/40]
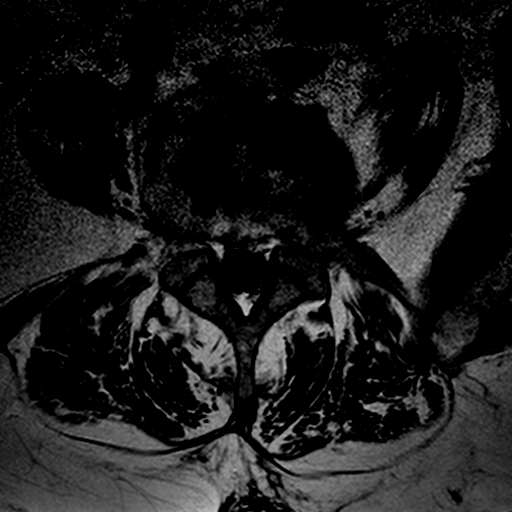
[im 11/40]
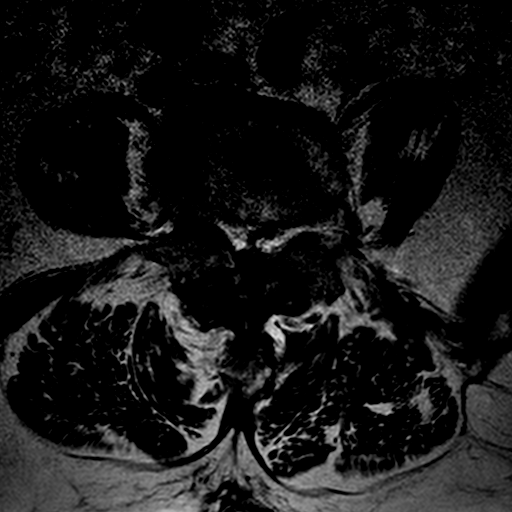
[im 18/40]
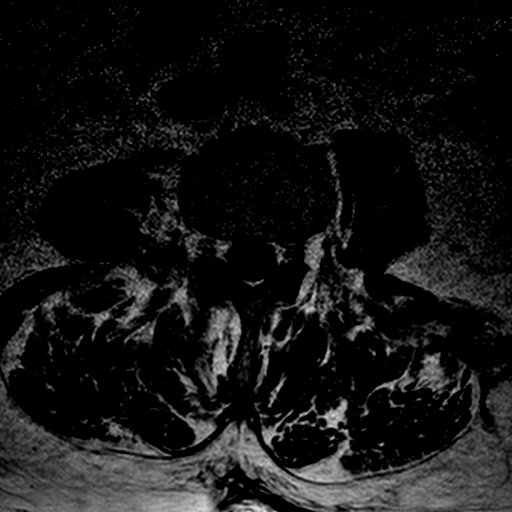
[im 22/40]
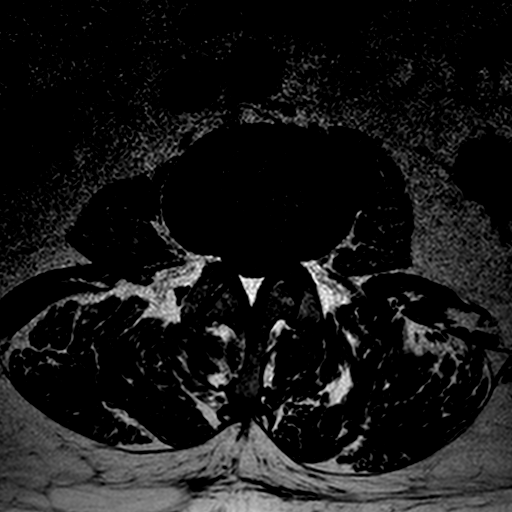
[im 29/40]
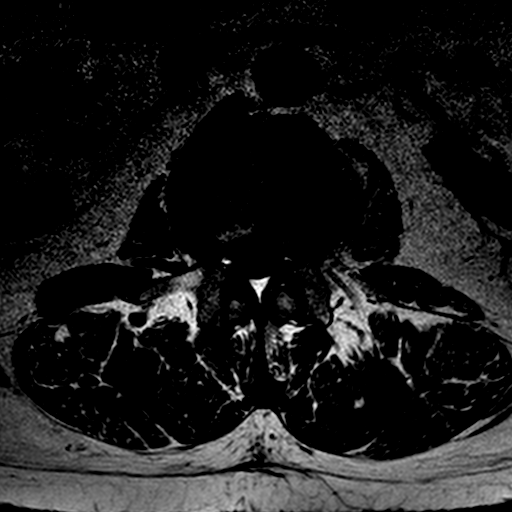
[im 32/40]
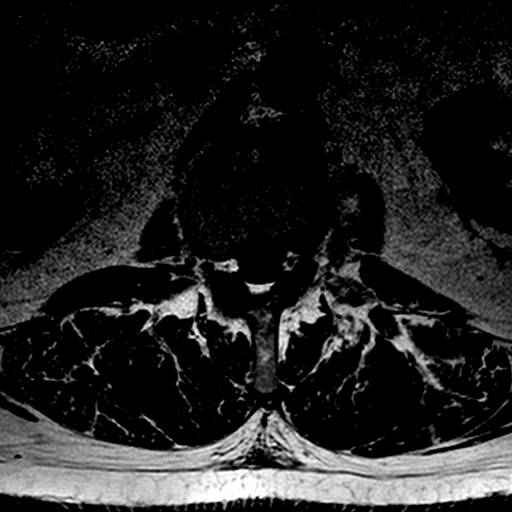
[im 36/40]
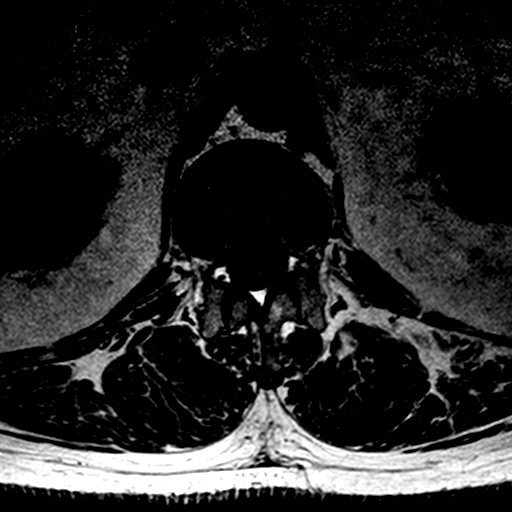
[im 40/40]
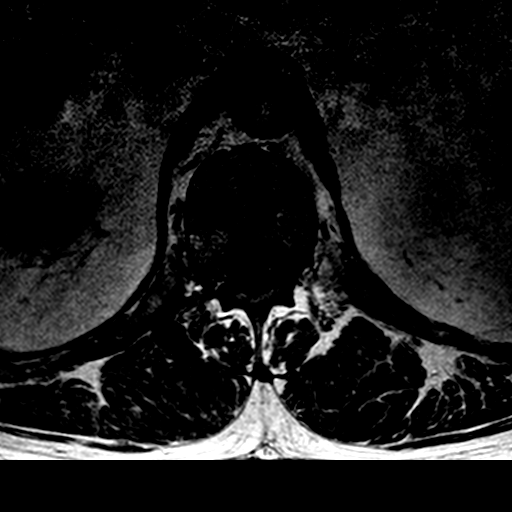

[Series 801: T2 · axial · 4.0mm · 0.67mm/px · z∈[-82,-4]mm · 6 of 18 slices shown (2 of 2)]
[im 1/18]
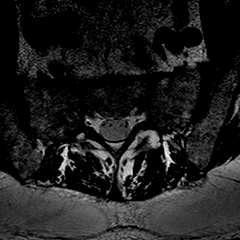
[im 4/18]
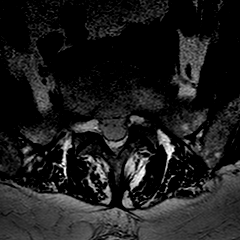
[im 7/18]
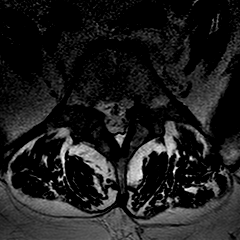
[im 11/18]
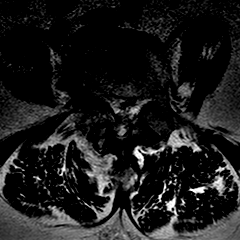
[im 14/18]
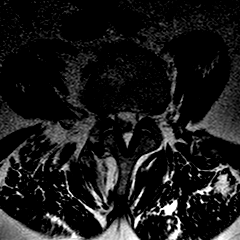
[im 18/18]
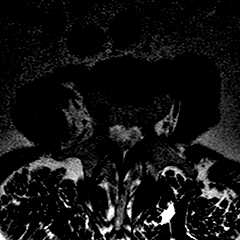

[28 of 48 positions shown; findings below may reference images not displayed]

FINDINGS: Lumbar vertebral heights are intact. There is grade 1 anterolisthesis 
at L4 and L5. There is marked disc narrowing at both levels. There are bilateral 
L4 and L5 pars defects. There is no evidence for compression fracture or spinal 
malignancy. The conus is normal.  Visualized sacrum is intact. Mild lumbar 
levoscoliosis. 
Sagittal STIR images show reactive endplate edema at L5-S1, L4-5, and along the 
left L1-2 interspace. 
Axial images show moderate canal stenosis on the right at L4-5 due to asymmetric 
ligamentous thickening and facet hypertrophy. There is broad-based disc bulge, 
with encroachment on the exiting right L5 nerve root. There is marked right 
foraminal stenosis impinging the distal right L4 nerve root, moderate left 
foraminal stenosis. 
At L5-S1 the canal is open. There is marked bilateral foraminal stenosis 
impinging the distal L5 nerve roots. 
At L3-4 the canal and foramina are open. There are moderate facet degenerative 
changes. 
At L2-3 there is mild canal stenosis and mild to moderate left foraminal 
narrowing. 
At L1-2 there is 3 mm degenerative retrolisthesis and disc bulge contributing to 
mild to moderate canal stenosis. There is moderate left, mild right foraminal 
stenosis.
IMPRESSION: Grade 1 anterolisthesis at L4-5 with moderate right-sided canal stenosis and 
severe right foraminal stenosis. There are bilateral L4 pars defects. 
Grade 1 anterolisthesis at L5-S1 with severe bilateral foraminal stenosis. There 
are bilateral L5 pars defects. 
There is degenerative reactive endplate edema at L4-5 and L5-S1. 
Mild to moderate canal stenosis at L1-2 with 3 mm retrolisthesis and reactive 
endplate edema. 
No evidence for fracture or malignancy. There is mild lumbar levoscoliosis.

## 2020-01-29 IMAGING — DX LUMBAR SPINE AP & LAT WITH FLEXION AND EXTENSION
1 series · 4 of 4 positions shown · non-contrast
Comparison: MR lumbar 12/10/2019, plain film series 11/11/2019

UNIT #1 
LUMBAR SPINE AP \T\T\T\ LAT WITH FLEXION AND EXTENSION, 01/29/2020 [DATE]: 
CLINICAL INDICATION: Central lower back pain and LEFT-sided pain radiating down 
both legs with walking
TECHNIQUE: 4 views lumbosacral spine including AP, lateral neutral, flexion, and 
extension

[Series 1: lateral · 0.14mm/px · 4 of 4 slices shown]
[im 1/4]
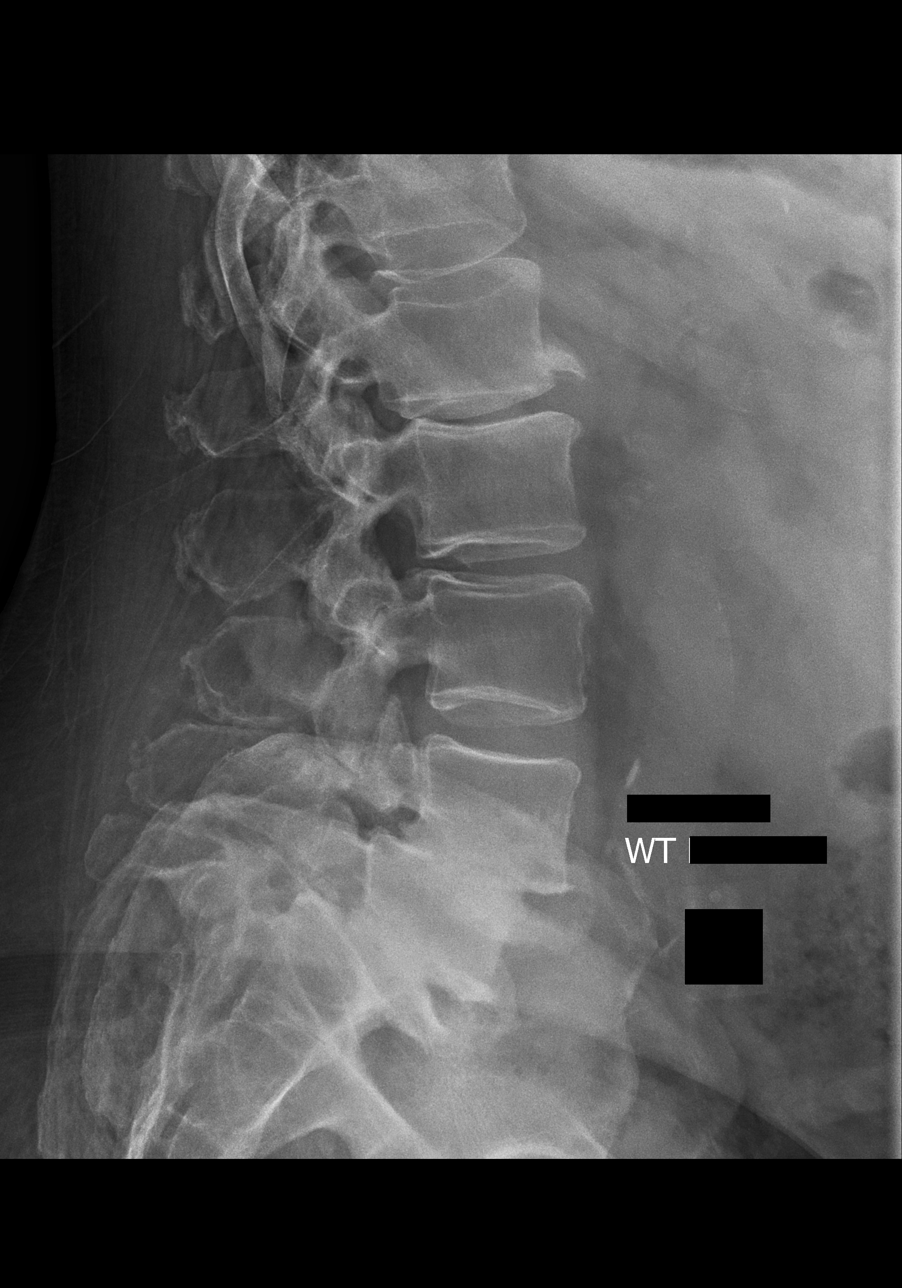
[im 2/4]
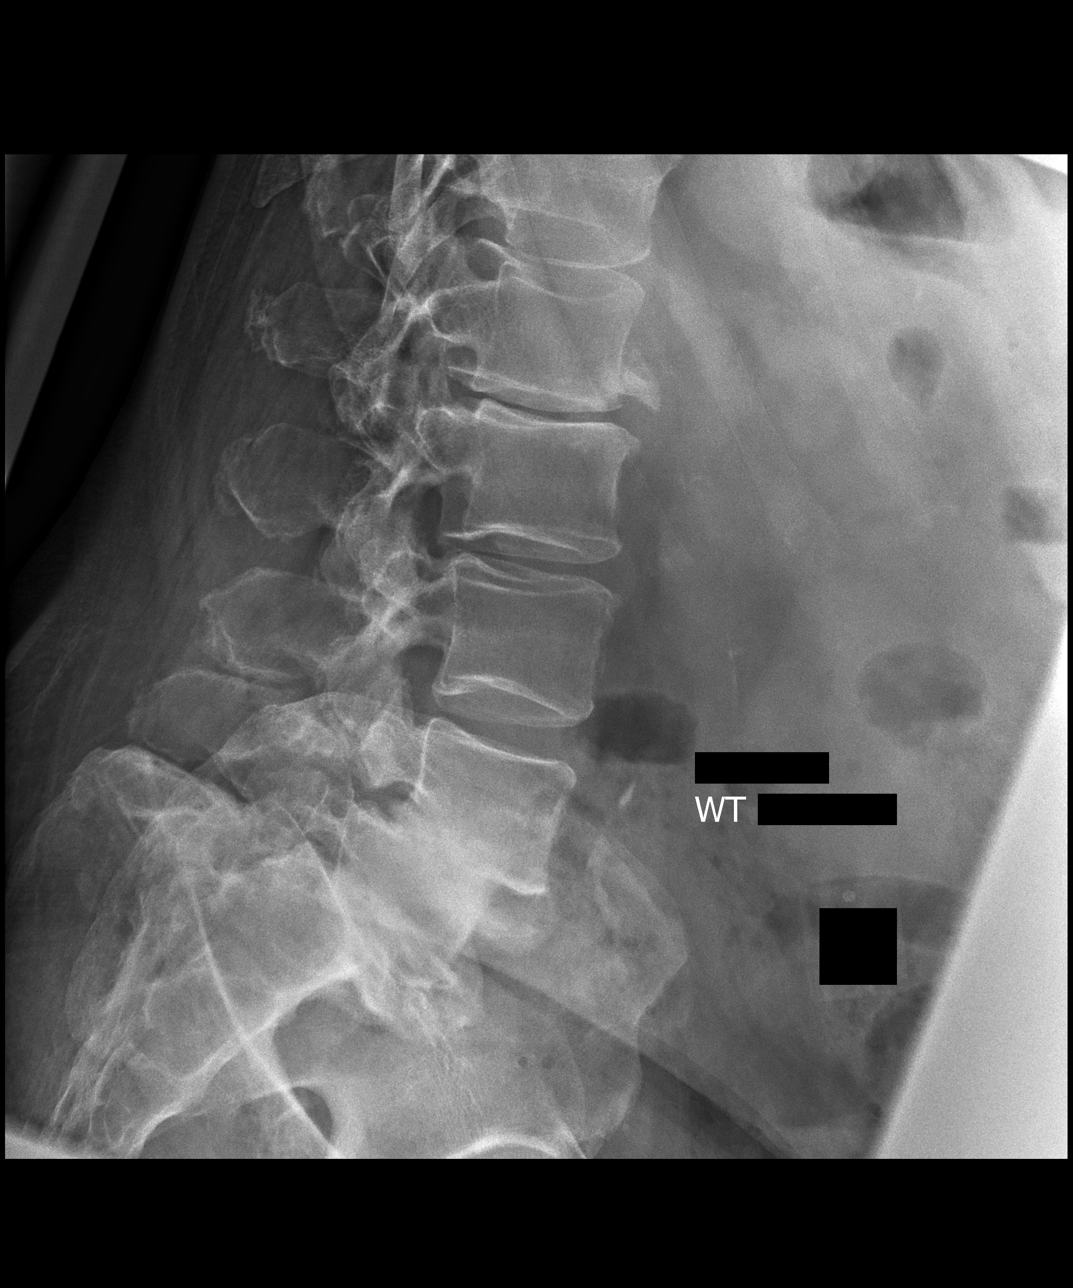
[im 3/4]
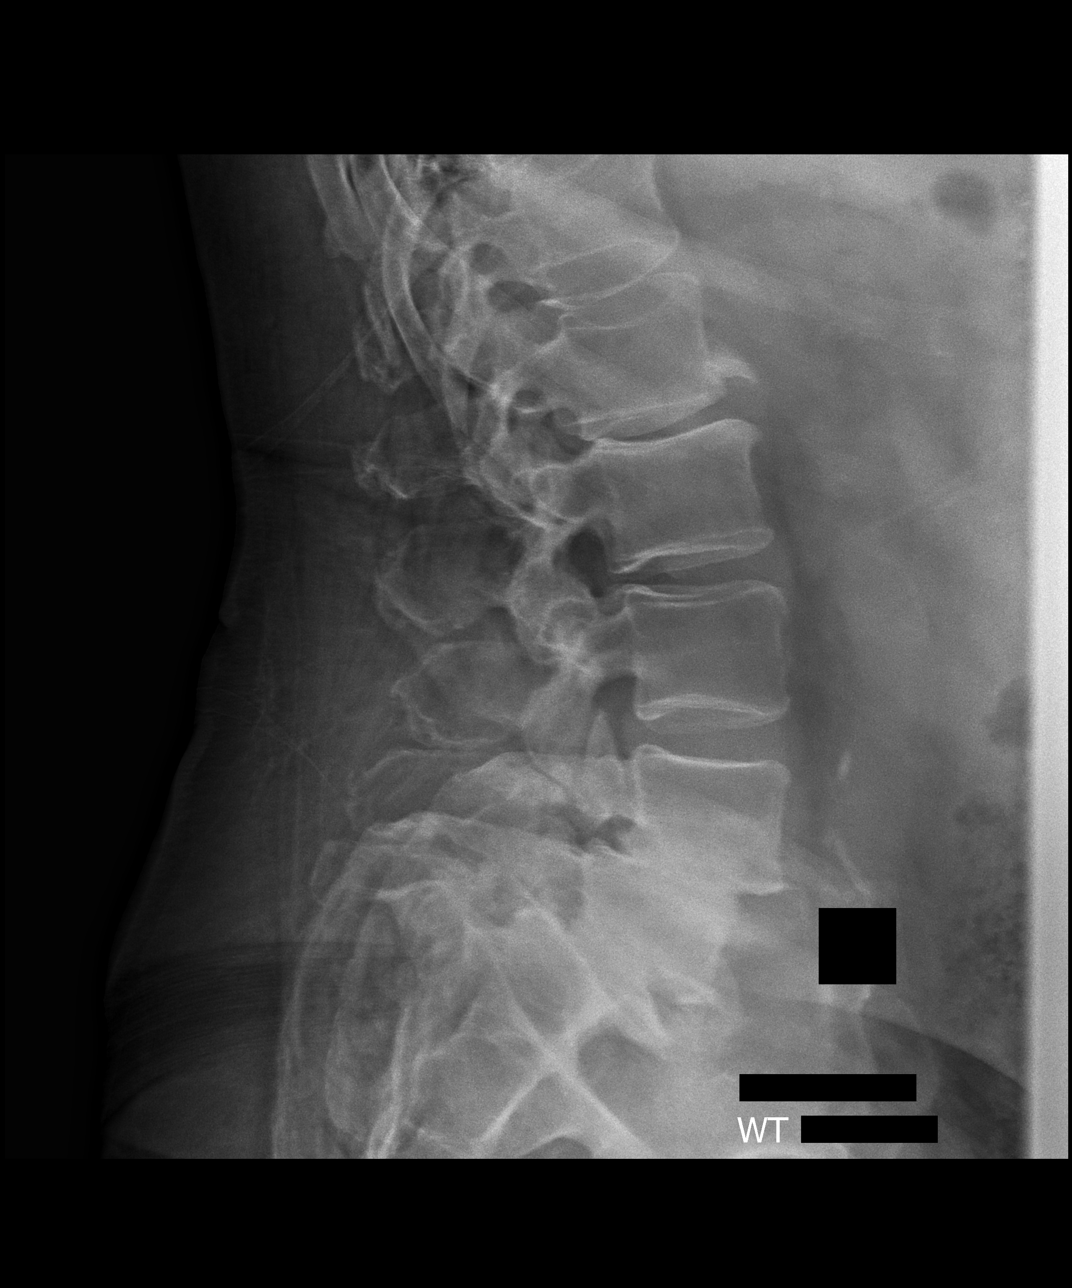
[im 4/4]
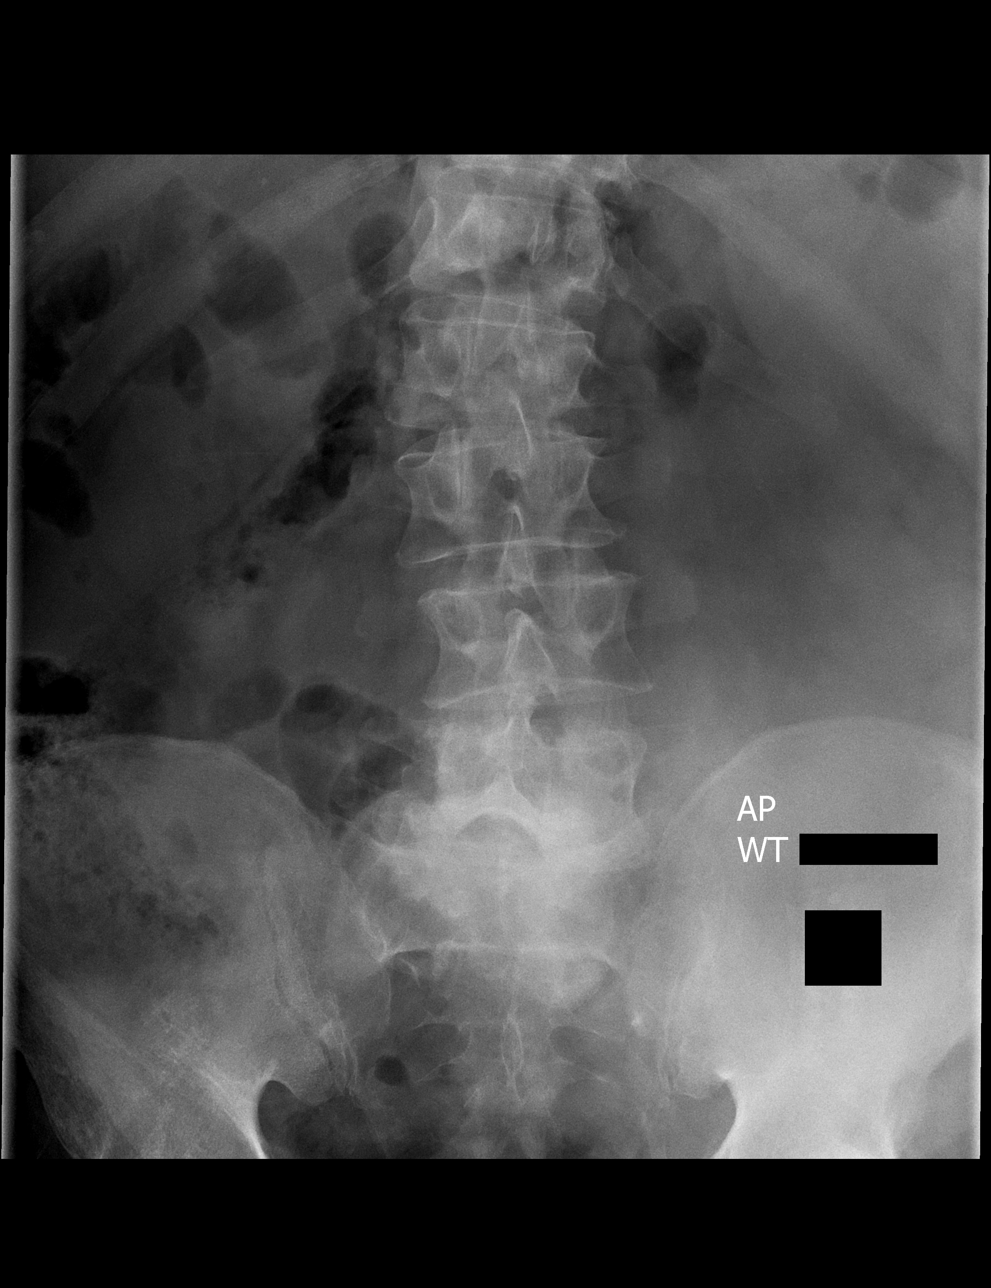

[4 of 4 positions shown; findings below may reference images not displayed]

FINDINGS: Again demonstrated is dextroscoliosis upper lumbar spine apex L1. 
Anterolisthesis L4 on L5 measures 12 mm in neutral, 13 mm in flexion, and 12 mm 
in extension, suggesting there is no dynamic instability. 
Anterolisthesis L5 on S1 measures 19 mm in neutral, approximately 16 mm in 
flexion and 16 mm in extension. The differences in these measurements may be due 
to tangent beam, with difficulty in flexion and extension. Complete loss of disc 
space at these 2 levels.
IMPRESSION: Grade 1 anterolisthesis L4 on L5 without dynamic instability. 
Grade 2 anterolisthesis L5 on S[DATE] show a small degree of dynamic instability. 
Dextroscoliosis. 
Multilevel degenerative spondylosis most severe at L1/L2.

## 2021-06-16 IMAGING — DX ABDOMEN FLAT AND UPRIGHT
1 series · 4 of 4 positions shown · non-contrast
Comparison: Radiograph of the lumbar spine of 01/29/2020.

________________________________________________________________________________________________ 
ABDOMEN FLAT AND UPRIGHT, 06/16/2021 [DATE]: 
CLINICAL INDICATION: Unspecified abdominal pain. Pain for one to 2 weeks with 
constipation.

[Series 1: erect · 0.14mm/px · 4 of 4 slices shown]
[im 1/4]
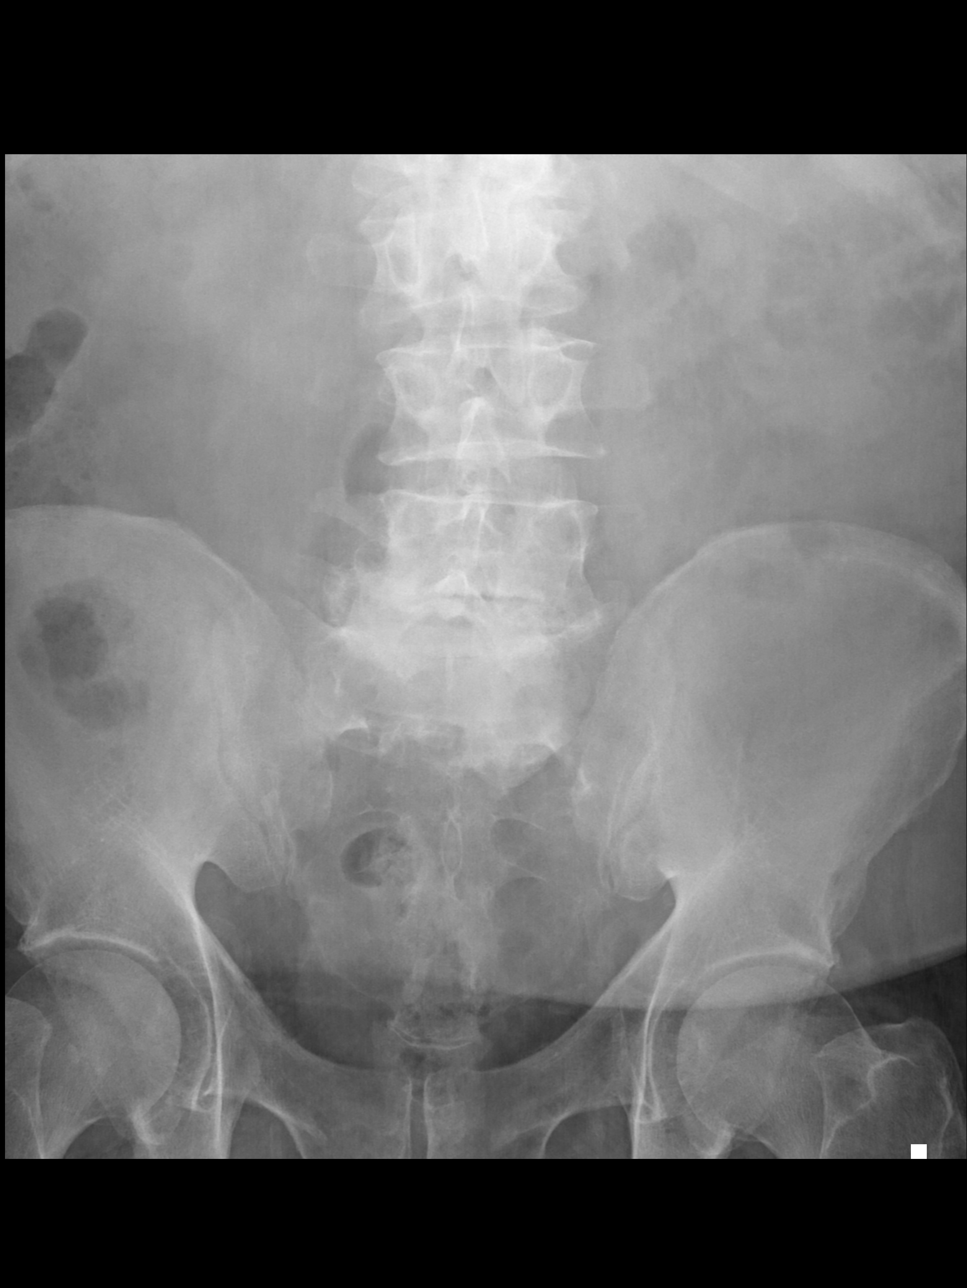
[im 2/4]
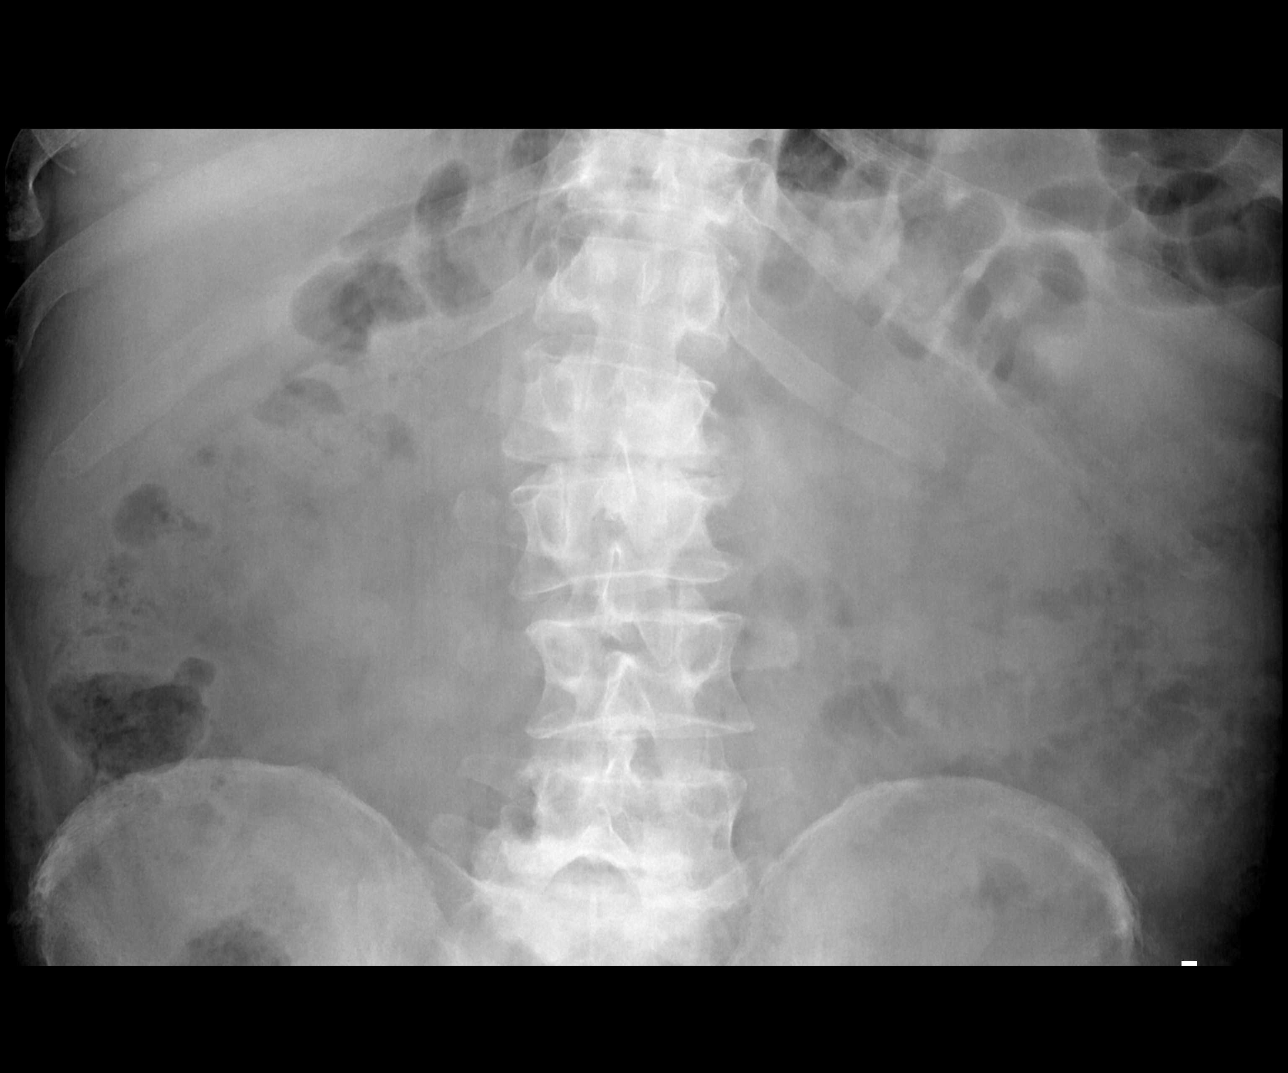
[im 3/4]
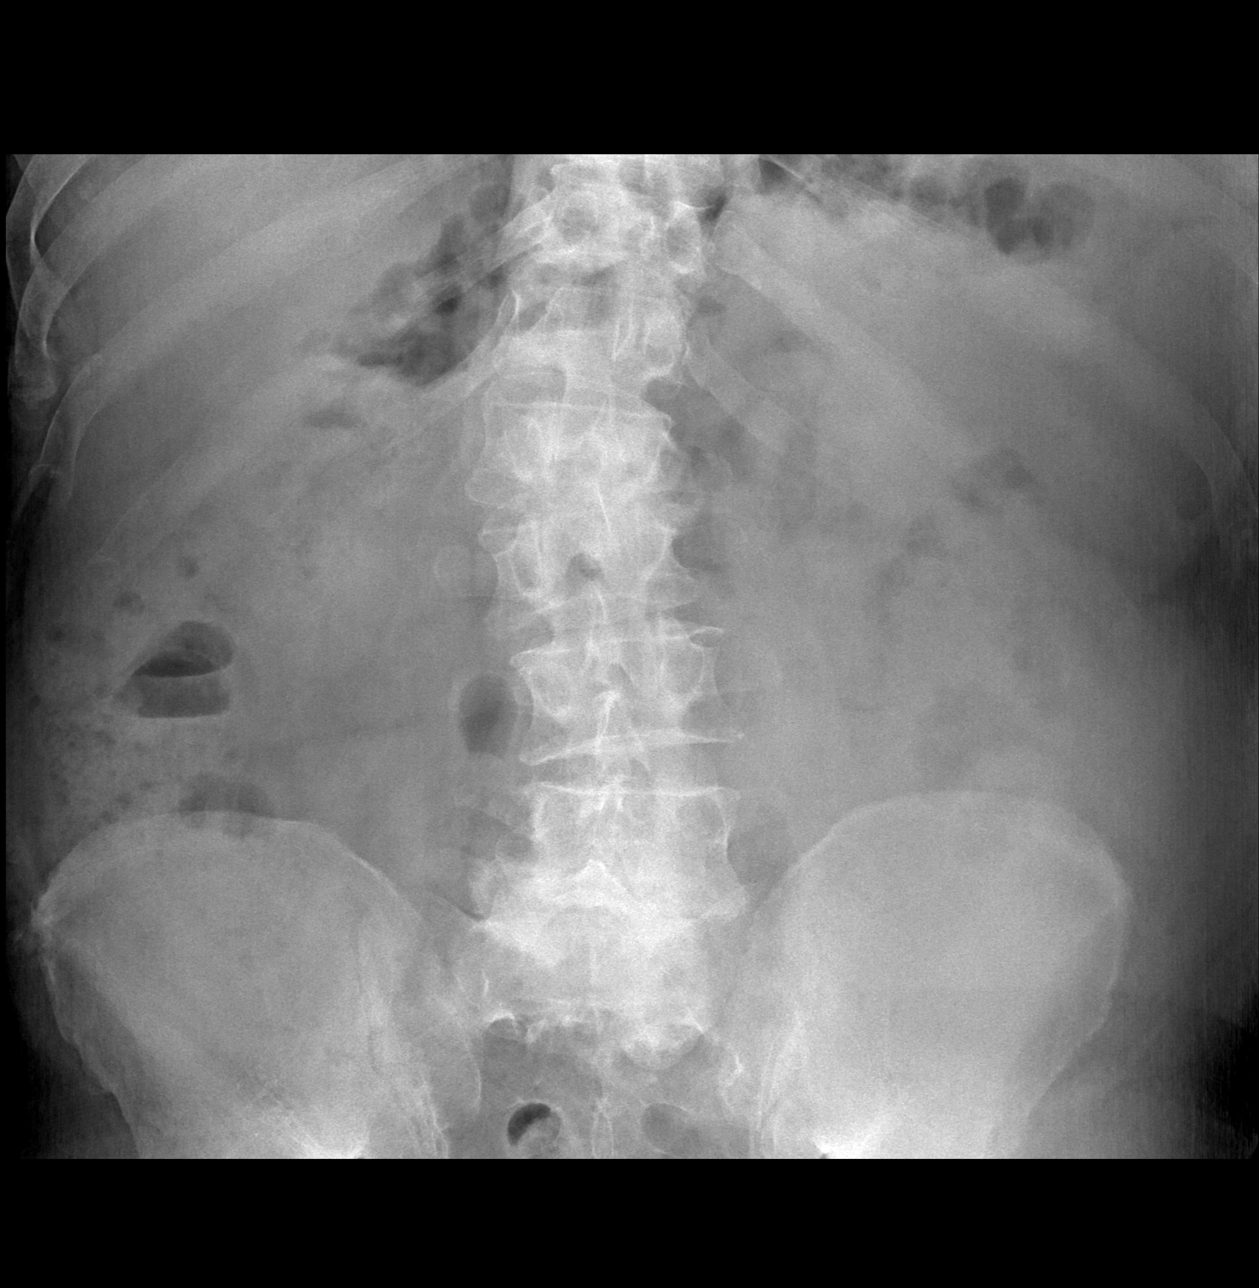
[im 4/4]
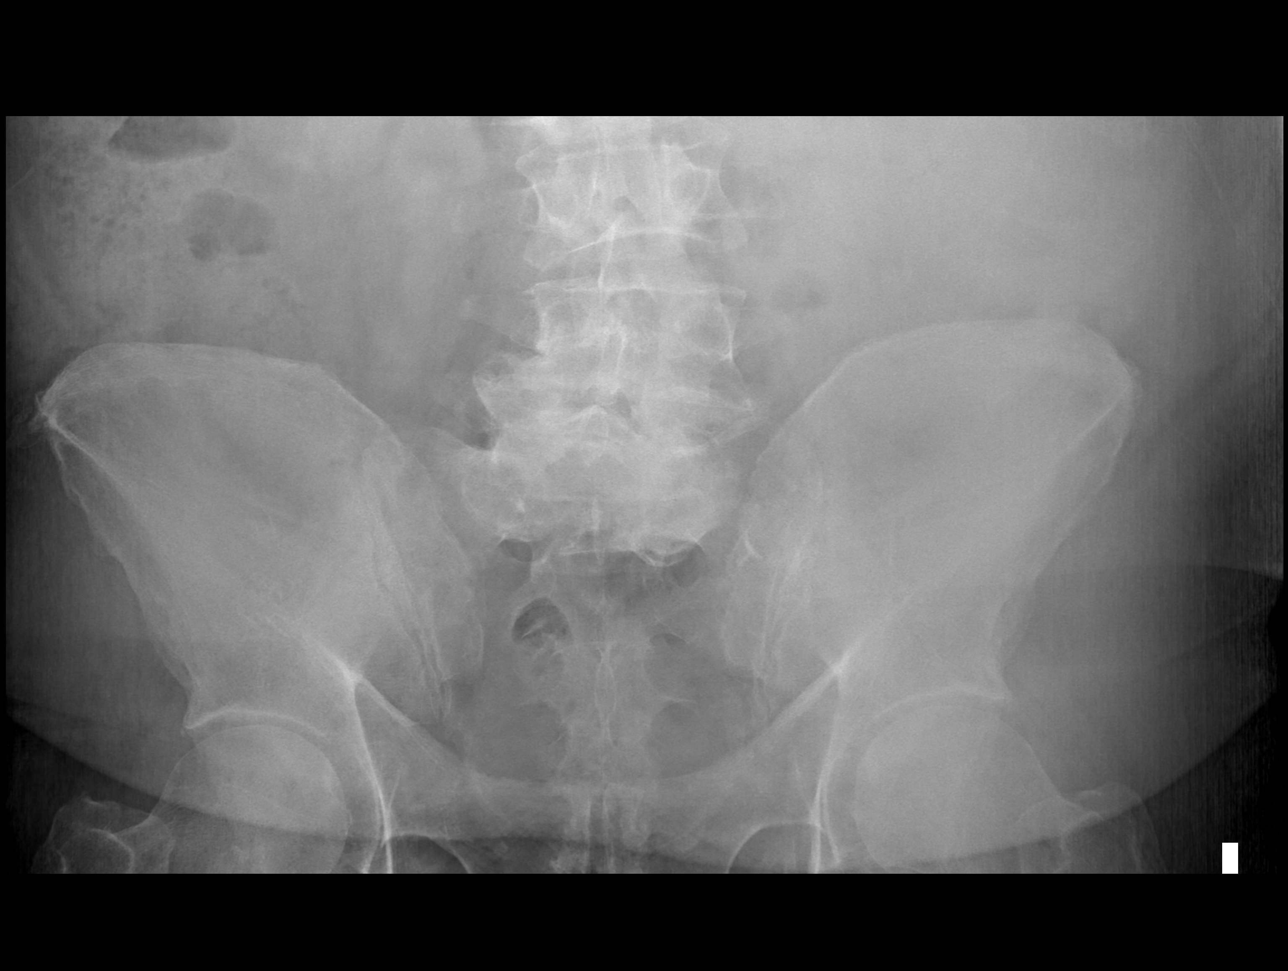

[4 of 4 positions shown; findings below may reference images not displayed]

FINDINGS: Normal abdominal bowel gas pattern. No radiopaque calculi. No 
pneumoperitoneum. Lumbar curvature with moderately advanced spondylotic changes.
IMPRESSION: No acute abnormality.

## 2022-07-24 IMAGING — CT CT BRAIN WITHOUT CONTRAST
3 series · 14 of 47 positions shown, 16 images · non-contrast
Comparison: None.

________________________________________________________________________________________________ 
CT BRAIN WITHOUT CONTRAST, 07/24/2022 [DATE]: 
CLINICAL INDICATION: Dizziness 2 weeks ago. 
A search for DICOM formatted images was conducted for prior CT imaging studies 
completed at a non-affiliated media free facility.
TECHNIQUE: The head was scanned from vertex through skull base without contrast 
on a high resolution CT scanner using dose reduction techniques. Routine MPR 
reconstructions were performed. Count of known CT and Cardiac Nuclear Medicine 
studies performed in the previous 12 months = 0. 
.

[Series 2: head 3.0 j30s 1 · axial · 0.44mm/px · z∈[-174,-30]mm · 8 of 56 slices shown, 10 images]
[im 4/56  brain]
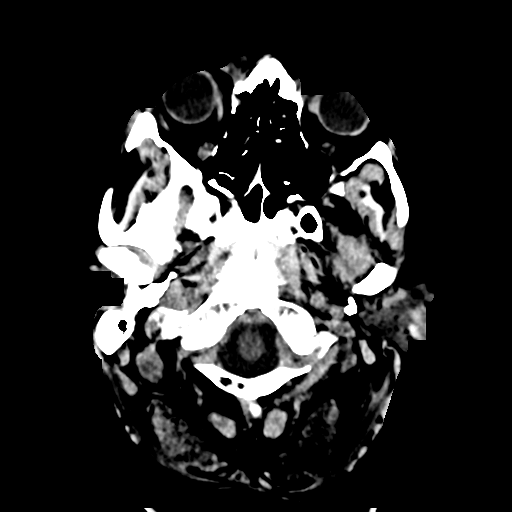
[im 4/56  bone]
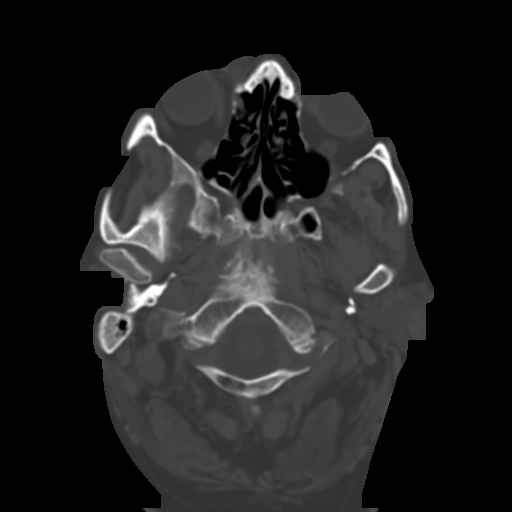
[im 12/56  brain]
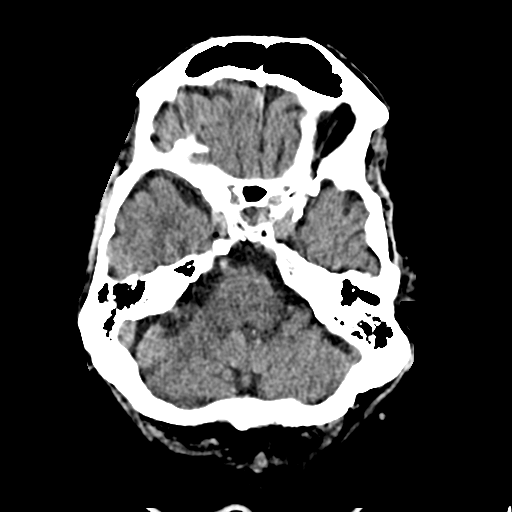
[im 18/56  brain]
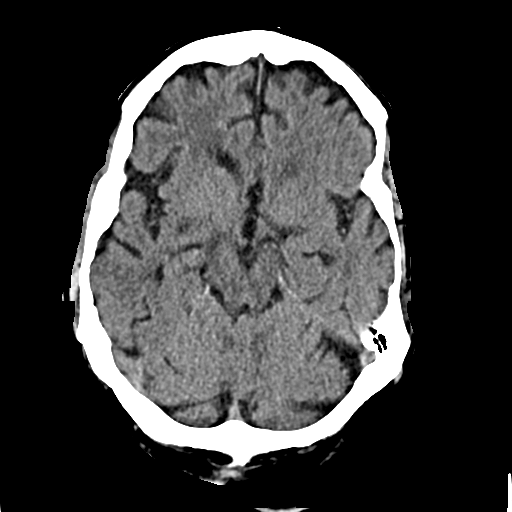
[im 25/56  brain]
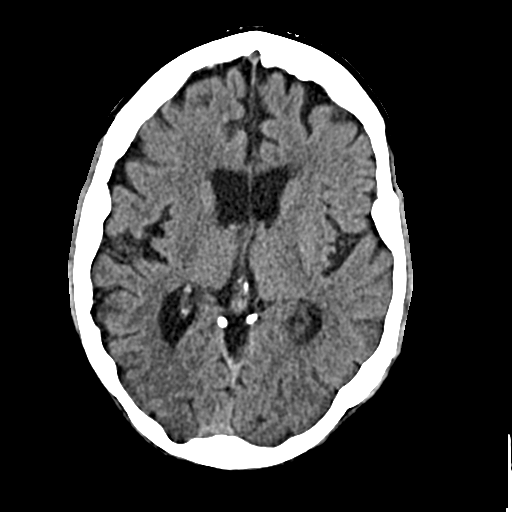
[im 31/56  brain]
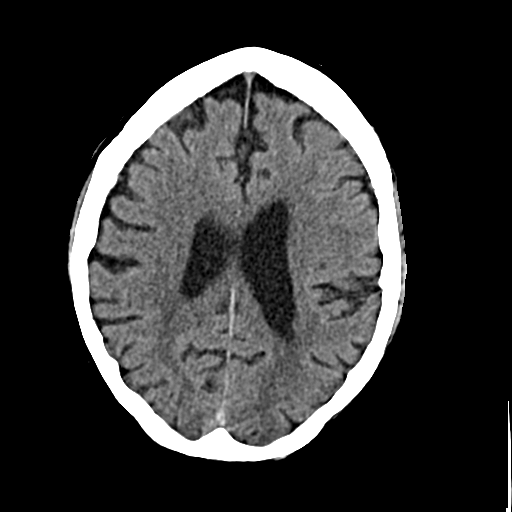
[im 31/56  bone]
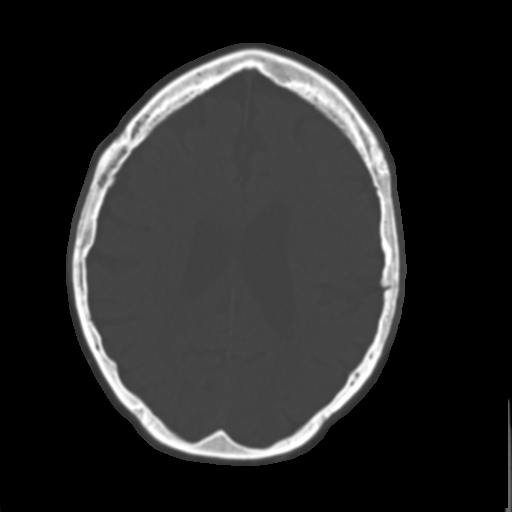
[im 38/56  brain]
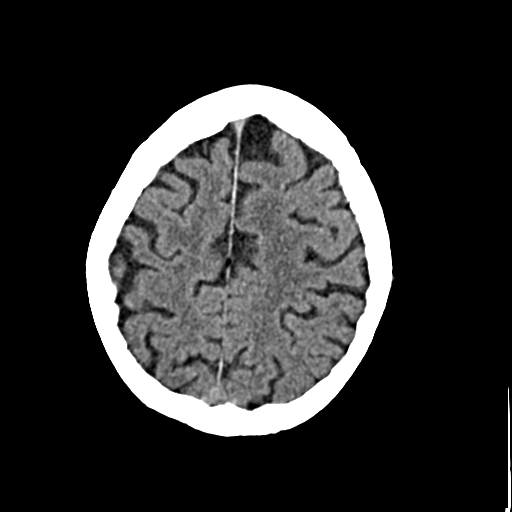
[im 44/56  brain]
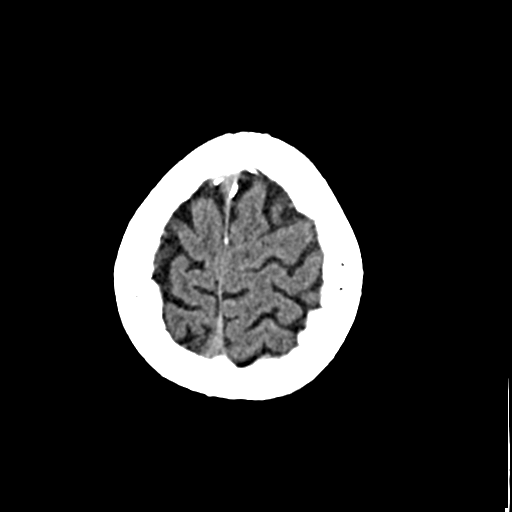
[im 52/56  brain]
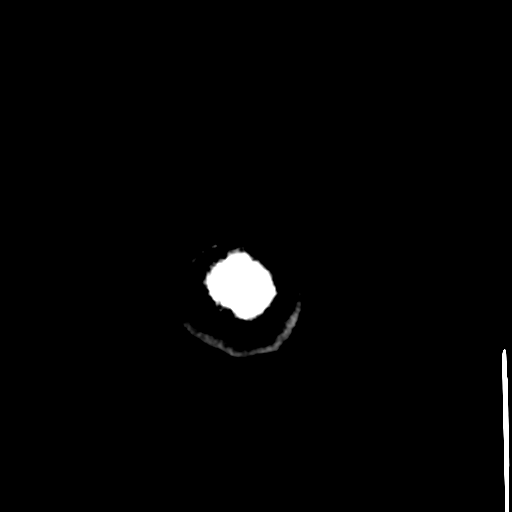

[Series 4: coronal · coronal · 0.33mm/px · 3 of 77 slices shown]
[im 26/77  brain]
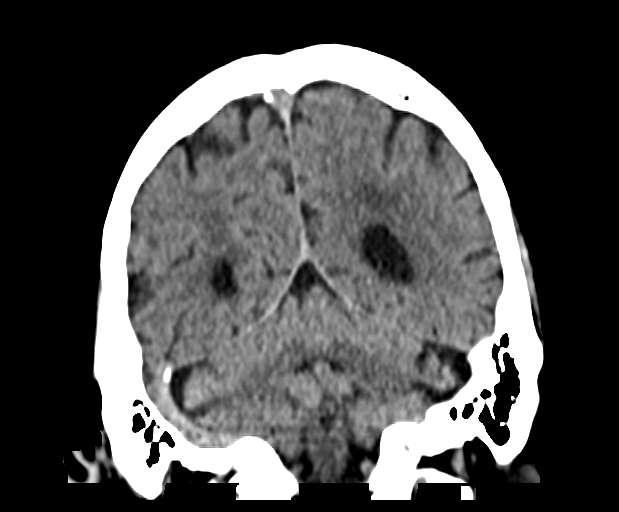
[im 34/77  brain]
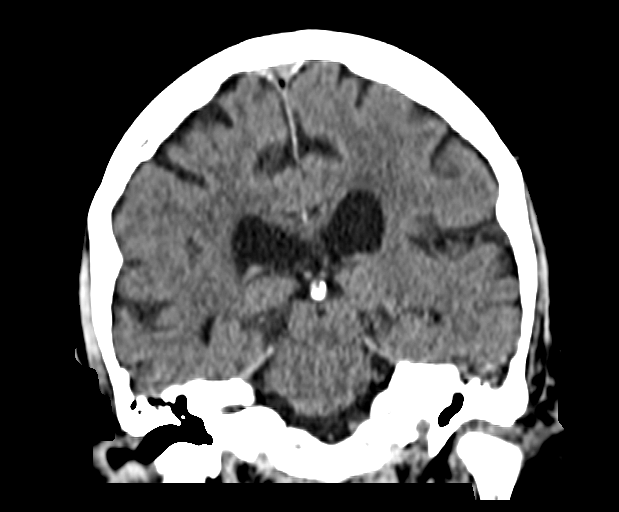
[im 43/77  brain]
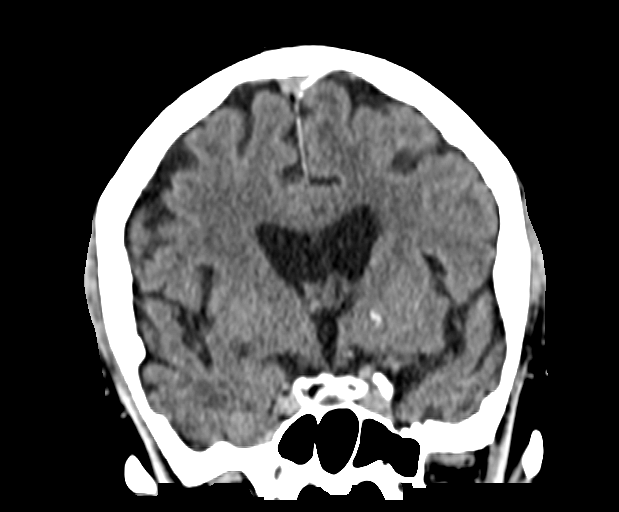

[Series 5: sagittal · sagittal · 0.34mm/px · 3 of 62 slices shown]
[im 21/62  brain]
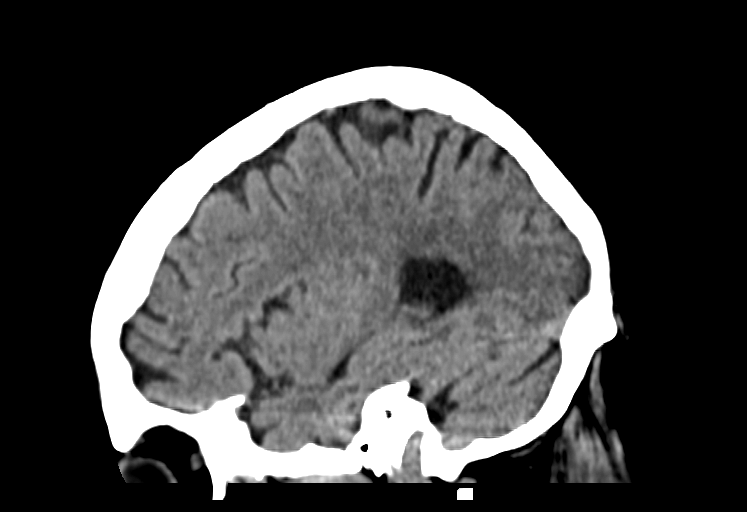
[im 31/62  brain]
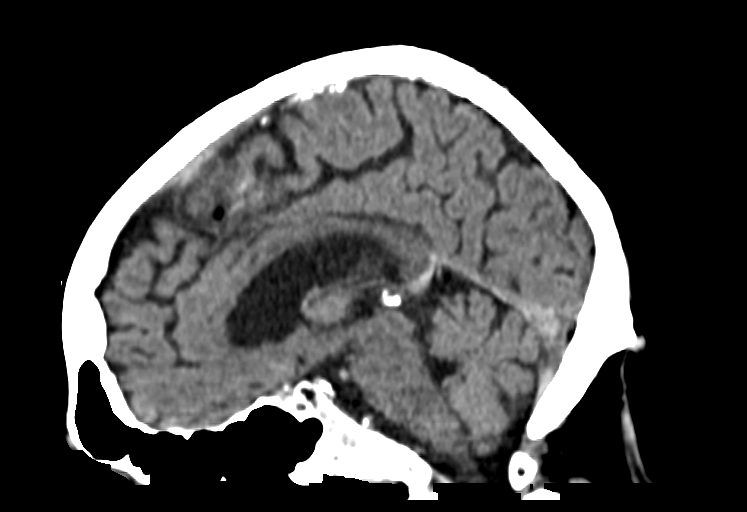
[im 41/62  brain]
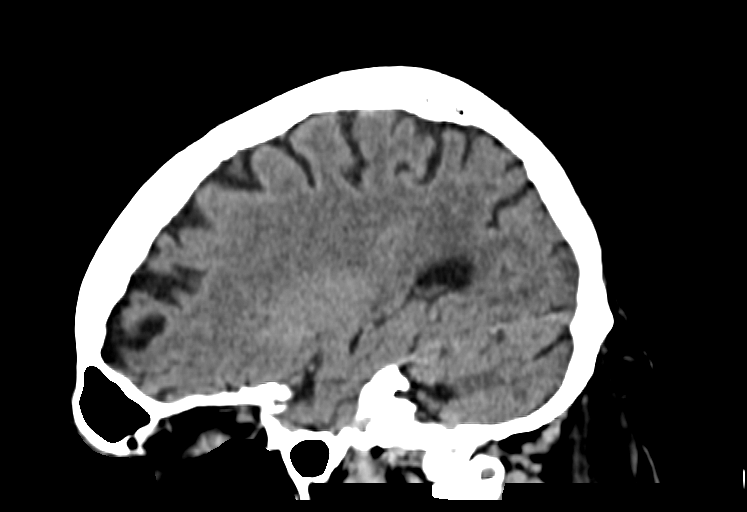

[14 of 47 positions shown; findings below may reference images not displayed]

FINDINGS: --------------------------------------------------------------------      
INTRACRANIAL: 
Periventricular and deep white matter change, probably secondary to 
microangiopathy. Intracranial vascular calcification. Brainstem, cerebellum, 
deep gray nuclei demonstrate no acute abnormality.  No large acute territorial 
ischemia, please note MRI is more sensitive for subtle acute ischemic change.  
No acute hemorrhage, midline shift, mass effect.    
-------------------------------------------------------------------- 
OTHER:  
ORBITS/SINUSES/T-BONES:  Visualized orbits show no acute abnormality or mass.  
Mastoid air cells and middle ear cavities are grossly clear.  Visualized 
paranasal sinuses are clear. 
BONES/SOFT TISSUES: No acute abnormality. 
--------------------------------------------------------------------
IMPRESSION: No acute intracranial abnormality.  White matter microangiopathic changes. 
RADIATION DOSE REDUCTION: All CT scans are performed using radiation dose 
reduction techniques, when applicable.  Technical factors are evaluated and 
adjusted to ensure appropriate moderation of exposure.  Automated dose 
management technology is applied to adjust the radiation doses to minimize 
exposure while achieving diagnostic quality images.

## 2022-08-02 IMAGING — CT CTA CHEST
3 of 5 series · 14 of 46 positions shown, 17 images · IV contrast (APPLIED)
Comparison: None.

________________________________________________________________________________________________ 
CTA CHEST, 08/02/2022 [DATE]: 
CLINICAL INDICATION: Shortness of breath. 
A search for DICOM formatted images was conducted for prior CT imaging studies 
completed at a non-affiliated media free facility.
TECHNIQUE: The chest was scanned from base of neck through the lung bases with 
100 mL of Isovue 370 MDV injected intravenously on a high resolution low dose CT 
scanner using dose reduction techniques. Routine MPR and MIP 3D renderings were 
reconstructed on an independent workstation with concurrent physician 
supervision. The patients eGFR was calculated to be 84.2 mL/min/1.73 m2 using 
the i-STAT device.

[Series 4: pe chest 2.0 i31s 3 · axial · 0.86mm/px · z∈[-310,-66]mm · 10 of 148 slices shown, 13 images]
[im 13/148  soft-tissue]
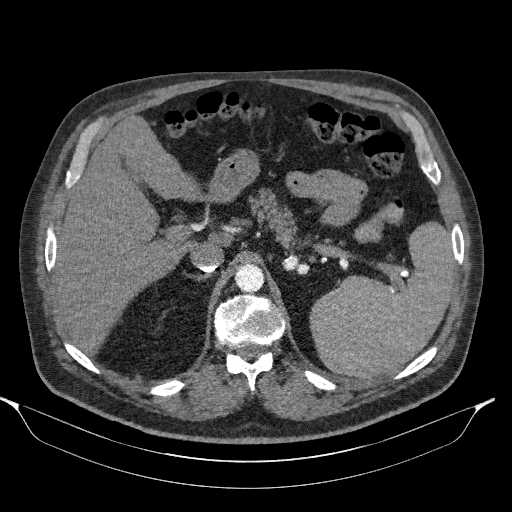
[im 13/148  bone]
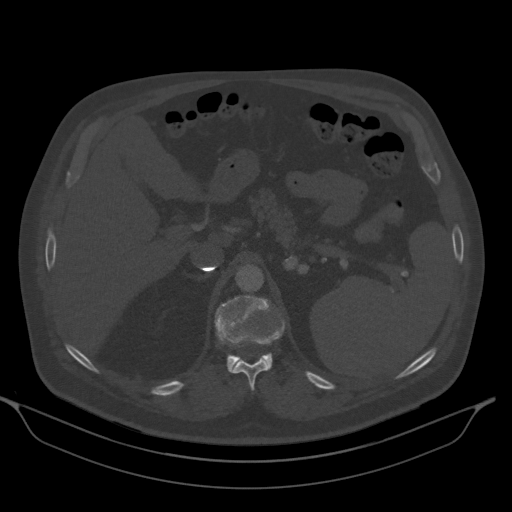
[im 25/148  soft-tissue]
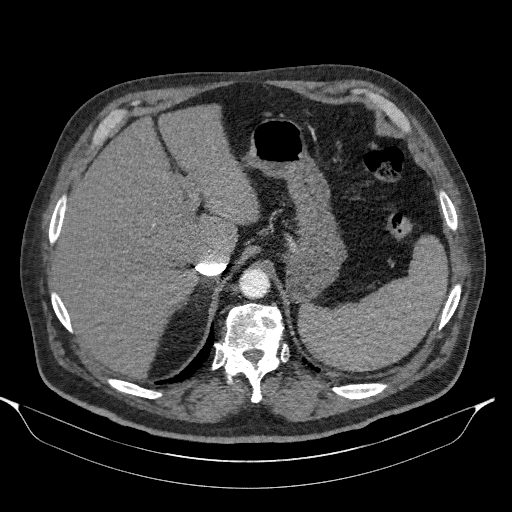
[im 37/148  soft-tissue]
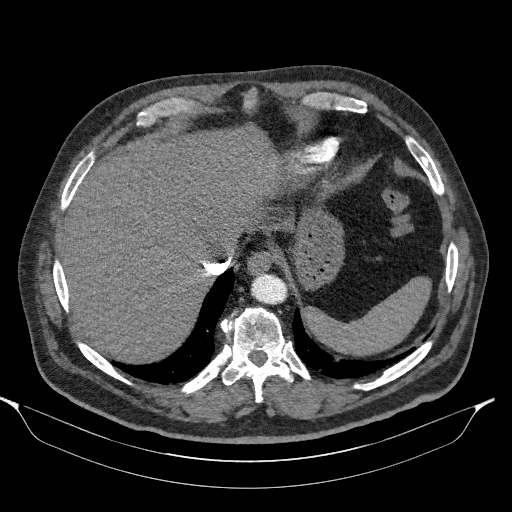
[im 56/148  soft-tissue]
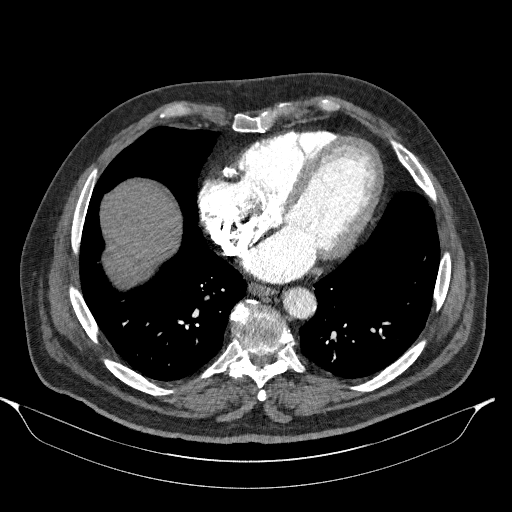
[im 68/148  soft-tissue]
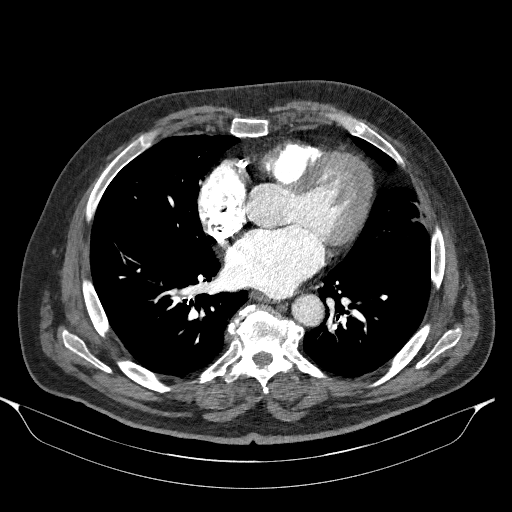
[im 68/148  bone]
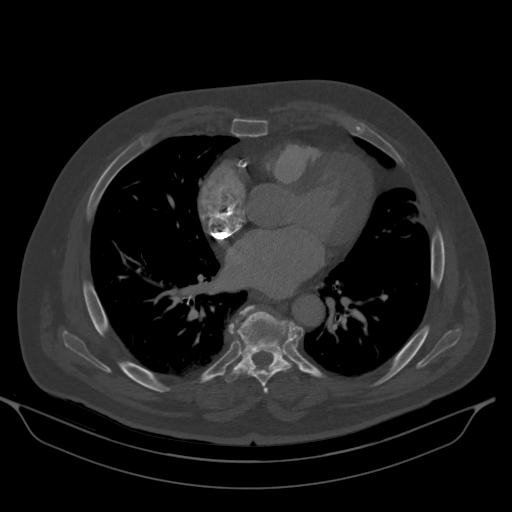
[im 80/148  soft-tissue]
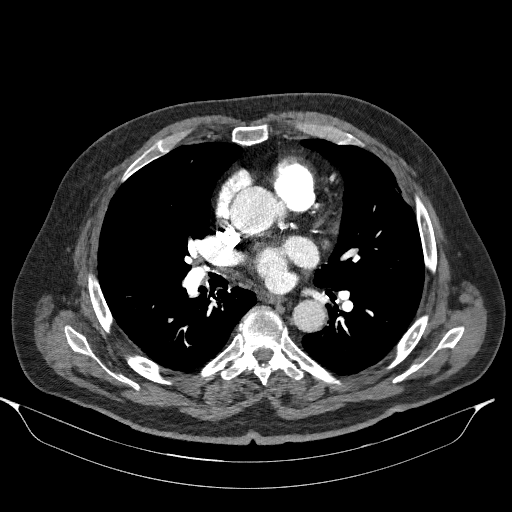
[im 92/148  soft-tissue]
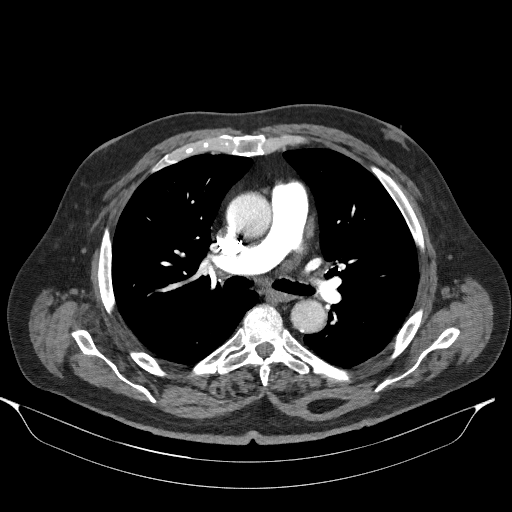
[im 111/148  soft-tissue]
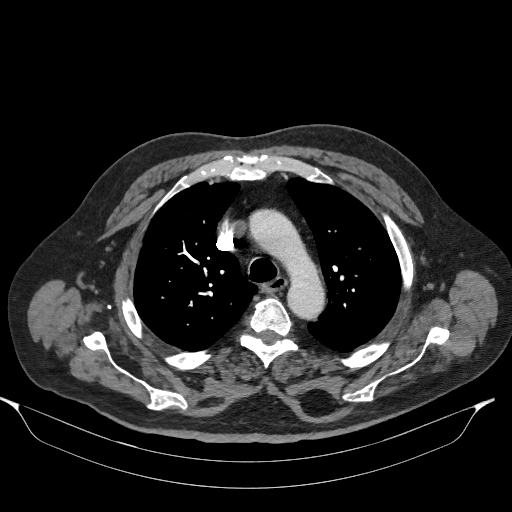
[im 123/148  soft-tissue]
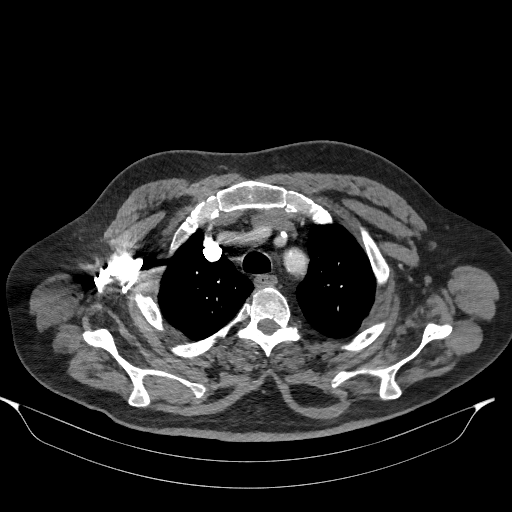
[im 123/148  bone]
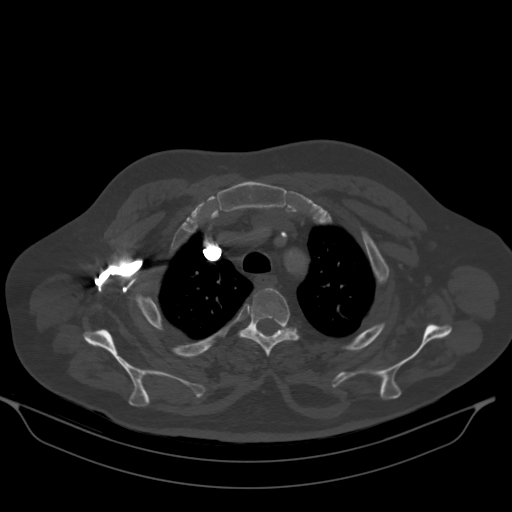
[im 135/148  soft-tissue]
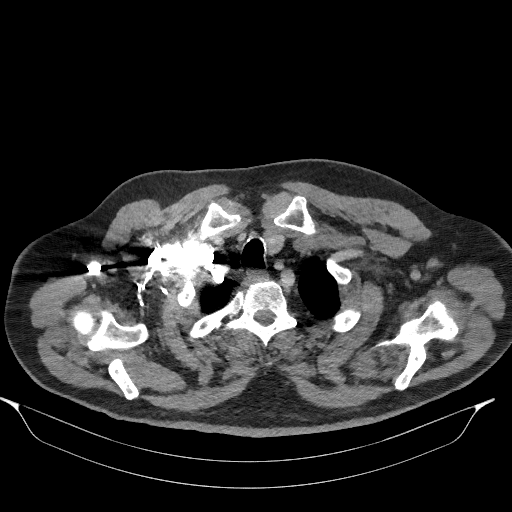

[Series 6: coronal · coronal · 0.60mm/px · 3 of 169 slices shown]
[im 43/169  soft-tissue]
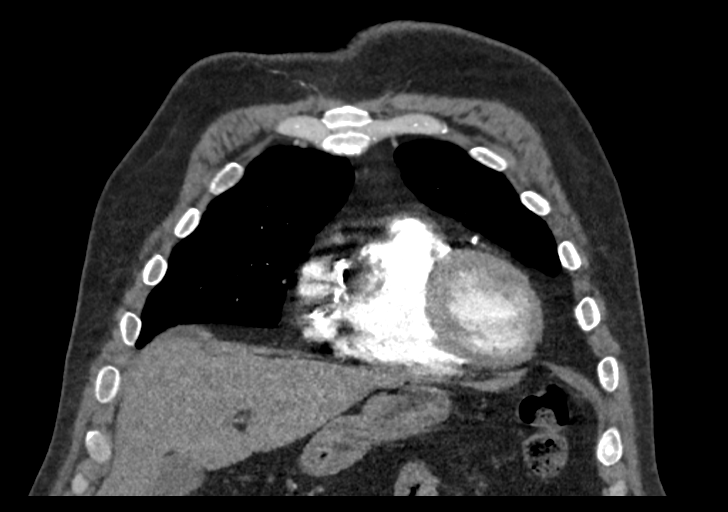
[im 85/169  soft-tissue]
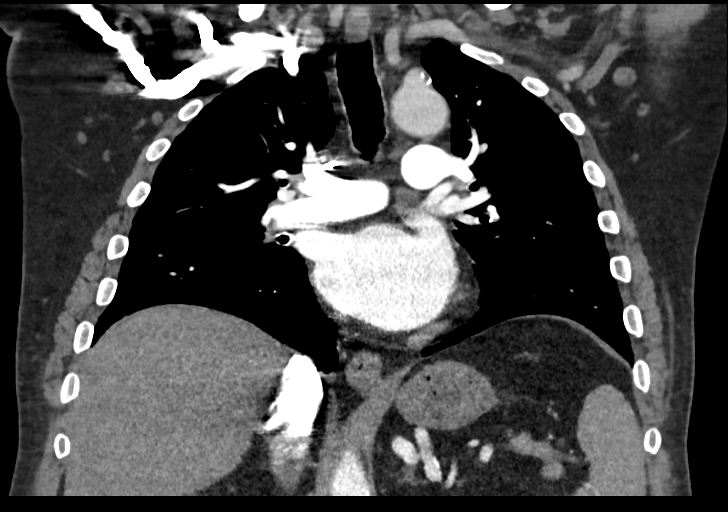
[im 127/169  soft-tissue]
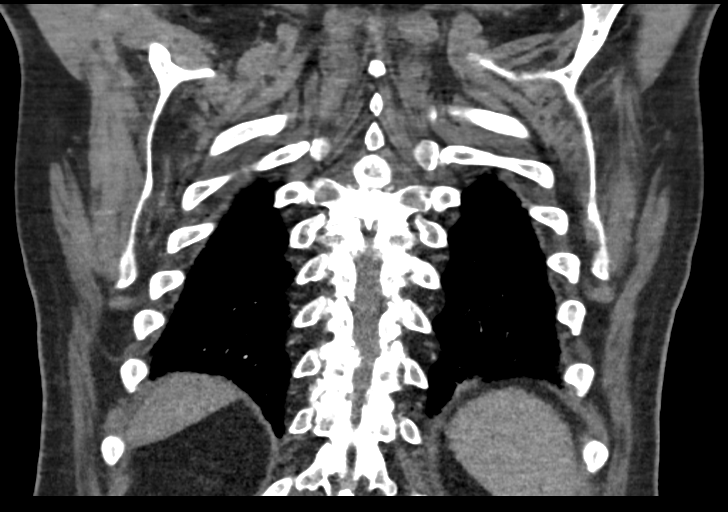

[Series 7: sagittal · sagittal · 0.60mm/px · 1 of 135 slices shown]
[im 45/135  soft-tissue]
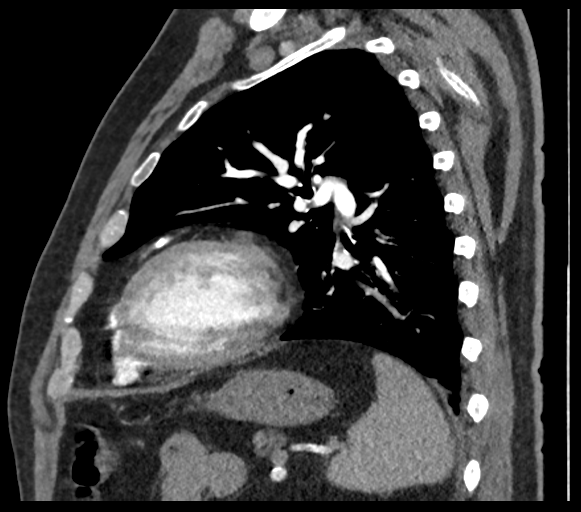

[14 of 46 positions shown; findings below may reference images not displayed]

Count of known CT and Cardiac Nuclear Medicine studies performed in the previous 
12 months = 1.
FINDINGS: PULMONARY ARTERIES: No pulmonary embolus. 
LUNGS AND PLEURA: Mild peribronchial thickening but no focal bronchiectasis. No 
acute consolidation or effusion Pleural thickening and rounded atelectasis 
within the lingula. MEDIASTINUM: No masses.  
LYMPH NODES: No adenopathy. 
HEART: Normal in size.  No pericardial effusion. Moderate severe coronary artery 
calcifications noted. 
AORTA AND GREAT VESSELS: No aneurysm or dissection. 
OSSEOUS STRUCTURES: Mild wedging T12 vertebral body with Schmorls node noted 
superior endplate L1. Moderate degenerative change.  
UPPER ABDOMEN: Prominent spleen with accessory spleen. Hepatic cyst. Reflux of 
contrast into the IVC and hepatic veins suggests right heart dysfunction..
IMPRESSION: No pulmonary embolus. 
Mild peribronchial thickening but no focal bronchiectasis. 
Pleural thickening along the lingula with rounded atelectasis but no acute 
consolidation or effusion. 
No mediastinal or hilar adenopathy 
In patients between the ages of 50-77 where pulmonary emphysema is noted on CT, 
recommend evaluation for low dose lung cancer screening protocol if patient is 
not already enrolled; as pulmonary emphysema is an independent risk factor for 
lung cancer. 
RADIATION DOSE REDUCTION: All CT scans are performed using radiation dose 
reduction techniques, when applicable.  Technical factors are evaluated and 
adjusted to ensure appropriate moderation of exposure.  Automated dose 
management technology is applied to adjust the radiation doses to minimize 
exposure while achieving diagnostic quality images.

## 2022-08-30 IMAGING — CT CT ABDOMEN PELVIS W/ UROGRAM
2 of 6 series · 14 of 46 positions shown, 16 images · IV contrast (APPLIED)
Comparison: 06/16/2021 [facility]   
Count of known CT and Cardiac Nuclear Medicine studies performed in the previous 
12 months = 2.

________________________________________________________________________________________________ 
CT ABDOMEN PELVIS W/ UROGRAM, 08/30/2022 [DATE]: 
CLINICAL INDICATION: Benign prostatic hyperplasia without lower urinary tract 
symptoms. Elevated PSA. 
A search for DICOM formatted images was conducted for prior CT imaging studies 
completed at a non-affiliated media free facility.
TECHNIQUE: The region of interest was scanned without and with 75 mL of Isovue 
300 MDV were injected intravenously on a high resolution CT scanner using dose 
reduction techniques. Routine MPR reconstructions were performed.

[Series 5: portal · axial · portal-venous · 0.92mm/px · z∈[-535,-85]mm · 11 of 182 slices shown, 13 images]
[im 16/182  soft-tissue]
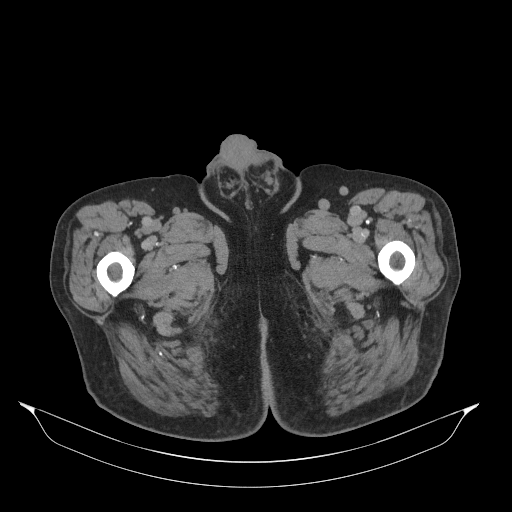
[im 16/182  bone]
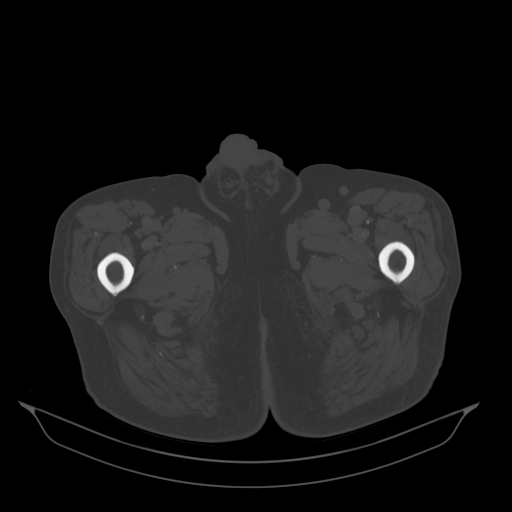
[im 31/182  soft-tissue]
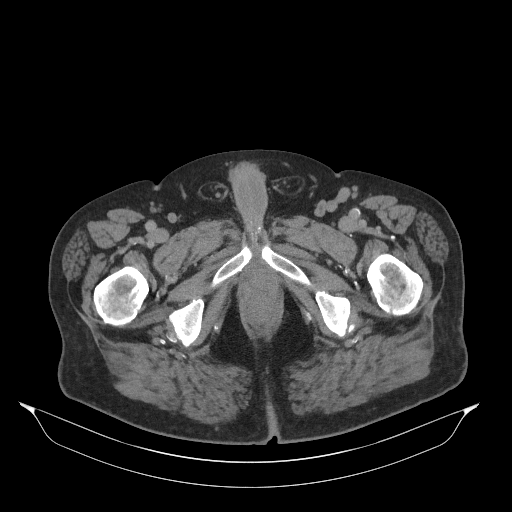
[im 46/182  soft-tissue]
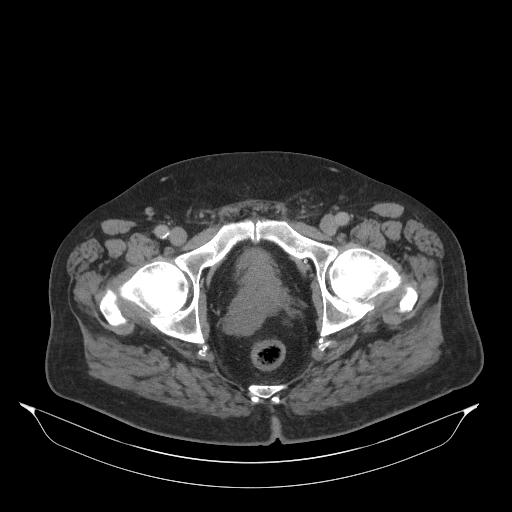
[im 61/182  soft-tissue]
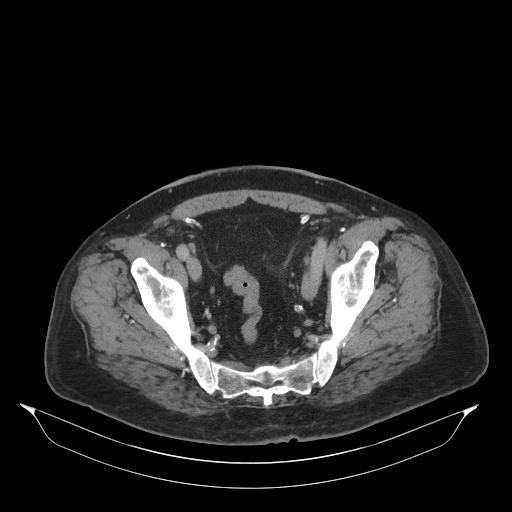
[im 76/182  soft-tissue]
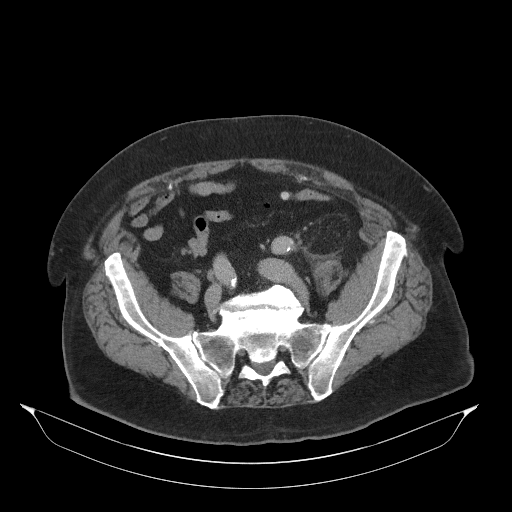
[im 91/182  soft-tissue]
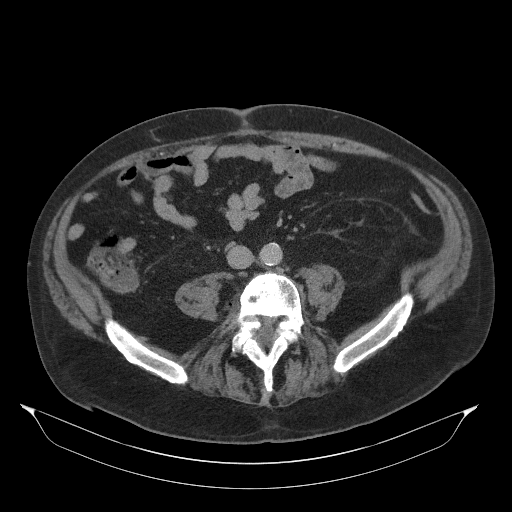
[im 106/182  soft-tissue]
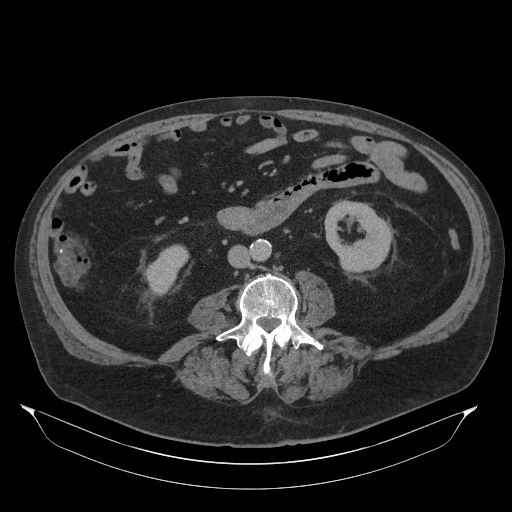
[im 121/182  soft-tissue]
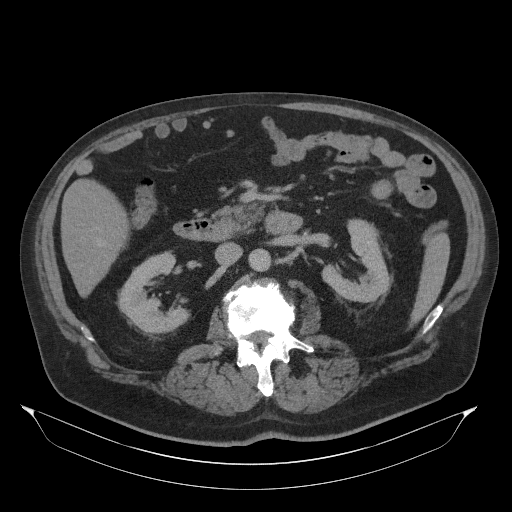
[im 136/182  soft-tissue]
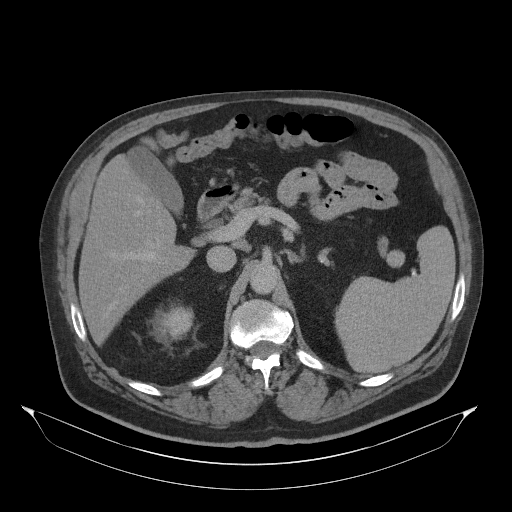
[im 136/182  bone]
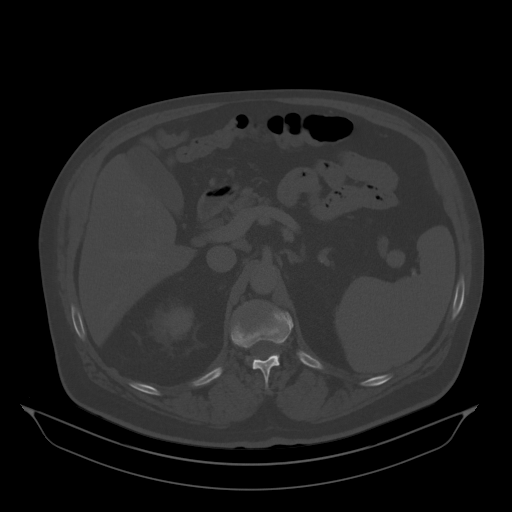
[im 151/182  soft-tissue]
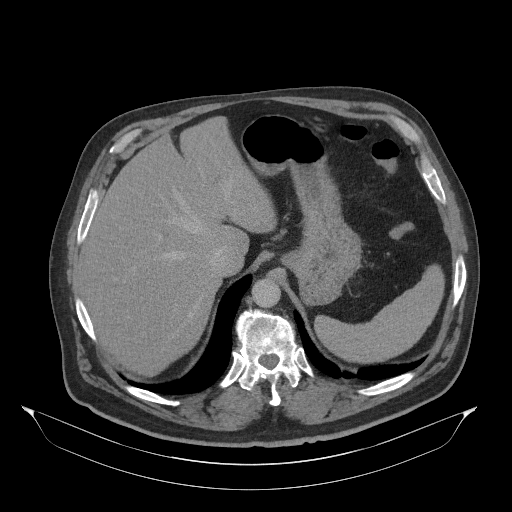
[im 166/182  soft-tissue]
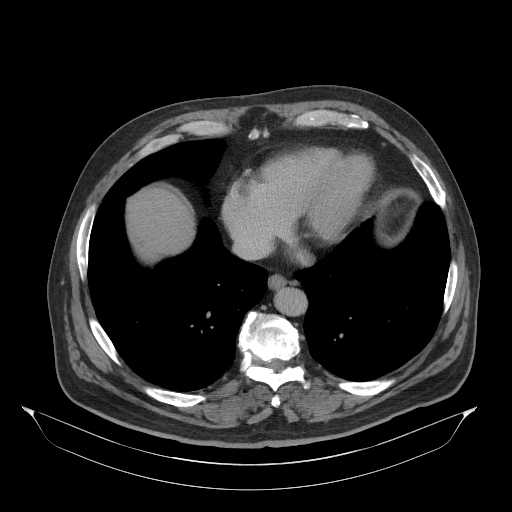

[Series 6: coronal · coronal · 0.97mm/px · 3 of 170 slices shown]
[im 43/170  soft-tissue]
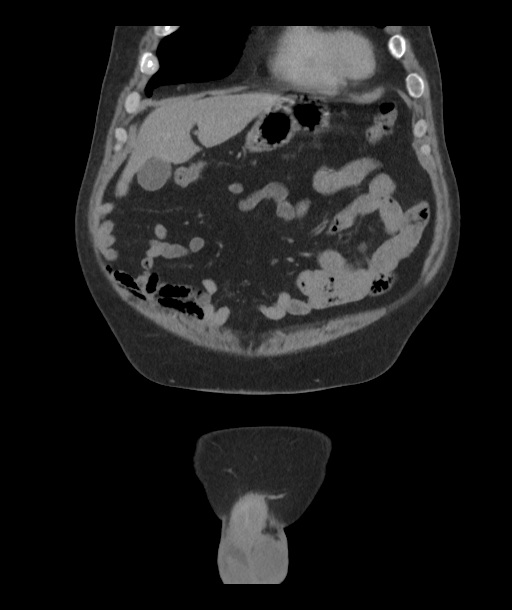
[im 85/170  soft-tissue]
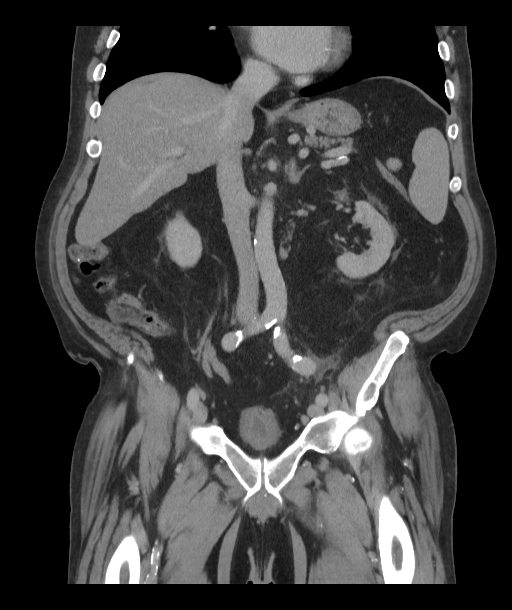
[im 127/170  soft-tissue]
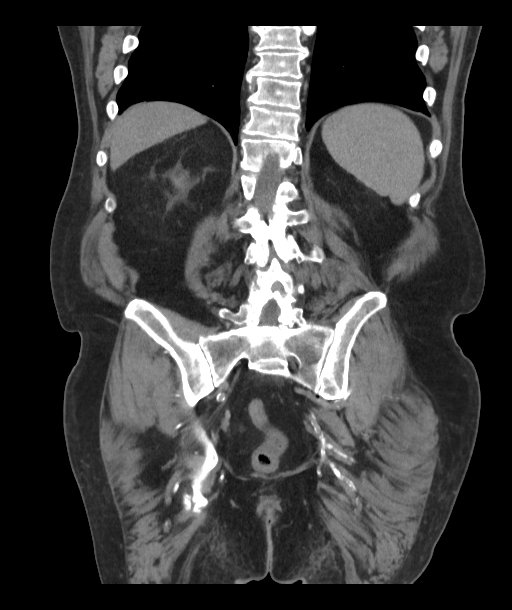

[14 of 46 positions shown; findings below may reference images not displayed]

FINDINGS: KIDNEYS: No masses.  No hydronephrosis.  No nephrolithiasis. 
URETERS: Normal caliber without filling defects.   
BLADDER: Bladder is partially distended. No mass.  Normal wall thickness.  No 
stone. 
LUNG BASES: Mild atelectasis. 
HEPATOBILIARY: Liver measures 17.6 cm in height. Fatty infiltration of the 
liver. 0.6 cm hepatic cyst. No mass or biliary dilatation. No gallstones. 
SPLEEN: Mild splenomegaly (15.3 cm). Splenule. 
PANCREAS: No mass.  No pancreatic fluid collections. 
ADRENALS: No masses. 
LYMPH NODES: No adenopathy. 
STOMACH, SMALL BOWEL AND COLON: Colonic diverticulosis without evidence of 
diverticulitis. No bowel wall thickening or obstruction. Appendectomy. 
VASCULAR STRUCTURES: No aneurysm. Atherosclerosis and marked coronary arterial 
calcifications. 
MUSCULOSKELETAL: No suspicious blastic lesions. Degenerative change with 
degenerative sclerosis. Mild chronic T11 vertebral body compression fracture, 
L4-L5 pars interarticularis defects, grade 1 anterolisthesis at L4-5 and/L5-S1 
and osteopenia. 
ADDITIONAL FINDINGS: Mild nonspecific perinephric and left inferior pararenal 
fat stranding. Mild prostatomegaly (5.5 x 3.8 cm in diameter). Prostatic 
calcifications. Prominent seminal vesicles.
IMPRESSION: 1.  Prostatomegaly. No adenopathy or evidence of metastases. 
2.  Fatty infiltration of the liver and 0.6 cm hepatic cyst. 
3.  Mild splenomegaly. 
4.  Colonic diverticulosis.  
5.  Appendectomy. 
6.  Atherosclerosis and marked coronary arterial calcifications. 
7.  Multiple chronic osseous findings. 
RADIATION DOSE REDUCTION: All CT scans are performed using radiation dose 
reduction techniques, when applicable.  Technical factors are evaluated and 
adjusted to ensure appropriate moderation of exposure.  Automated dose 
management technology is applied to adjust the radiation doses to minimize 
exposure while achieving diagnostic quality images.

## 2022-09-27 IMAGING — MR MULTI PARAMETRIC MRI PROSTATE W/O AND W
14 series · 48 of 48 positions shown · IV contrast (gadavist)
Comparison: None

________________________________________________________________________________________________ 
MULTI PARAMETRIC MRI PROSTATE W/O AND W, 09/27/2022 [DATE]: 
CLINICAL INDICATION: Elevated PSA. BPH.
TECHNIQUE: Multiple parametric sequences were performed Pre-contrast: T1 axial 
of the entire pelvis. T2 sagittal, axial  and coronal,T1 axial, acquired of the 
prostate. Diffusion with multiple B values of 6888, 8866 calculated ADC value 
for mapping. Post contrast: Rapid sequence dynamic and axial planes through the 
prostate and seminal vesicles,T1 axial with fat sat of the entire pelvis. 3-D 
renderings were reconstructed on an independent workstation. The images were 
also evaluated with Dyna CAD computer aided detection. 10.5 mL of Gadavist were 
injected intravenously. 4.5 mL of Gadavist  discarded. As per [HOSPITAL] guidelines 3D reconstructions are performed with concurrent 
physician supervision. Patient was scanned on a 3T magnet.

[Series 101: survey-mst · axial · 10.0mm · 1.34mm/px · 1 of 14 slices shown]
[im 1/14]
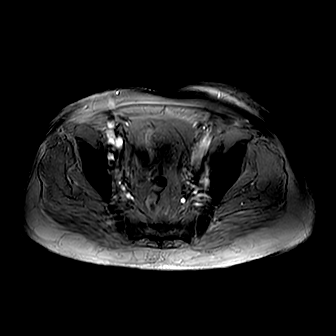

[Series 201: t1w_tse_ax · axial · 6.0mm · 0.37mm/px · 1 of 36 slices shown]
[im 1/36]
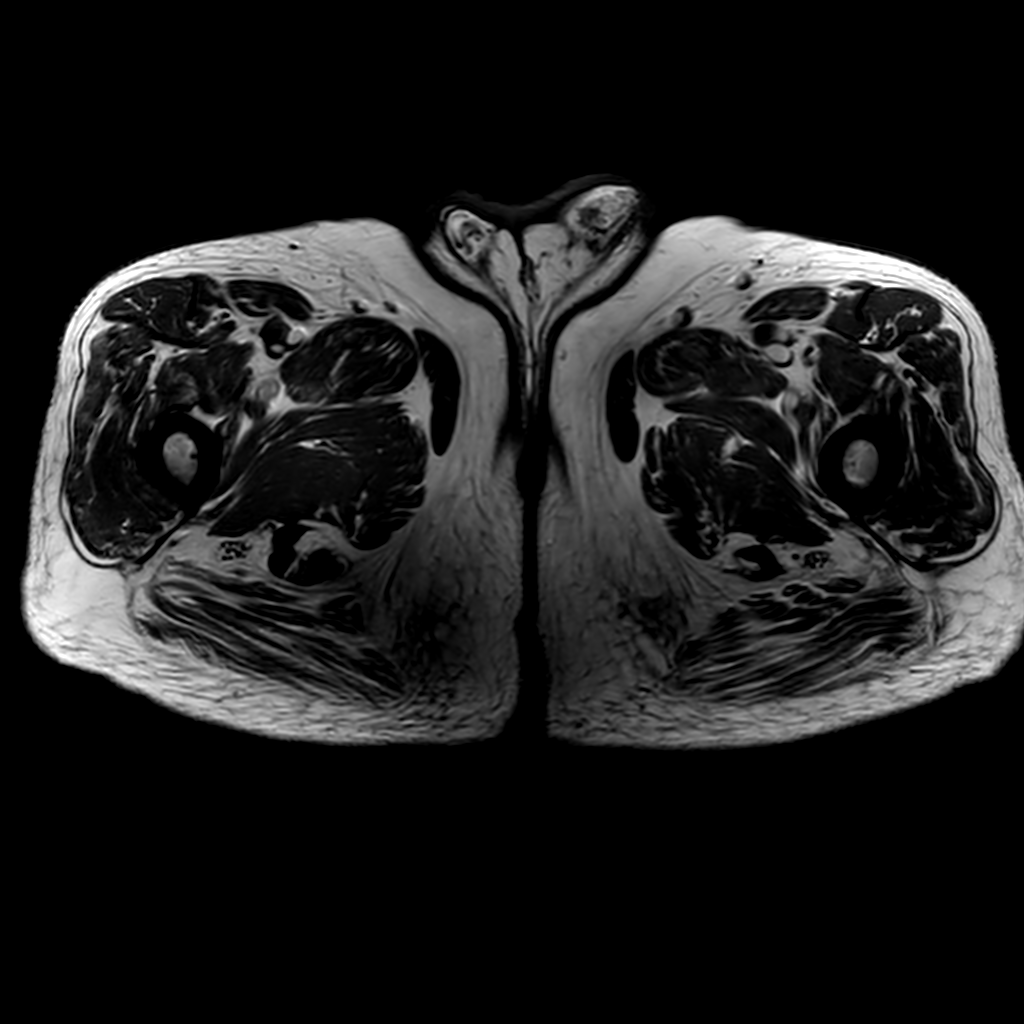

[Series 301: t2w sag · sagittal · 3.0mm · 0.42mm/px · 1 of 30 slices shown]
[im 1/30]
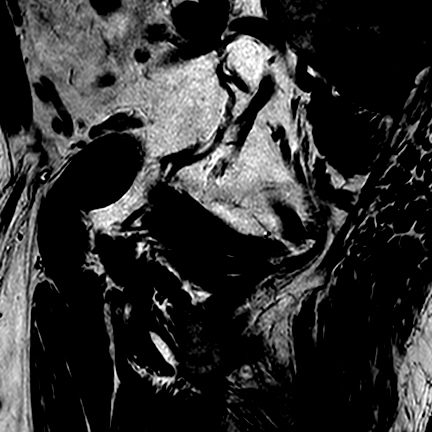

[Series 401: t2w cor · coronal · 3.0mm · 0.42mm/px · 1 of 32 slices shown]
[im 1/32]
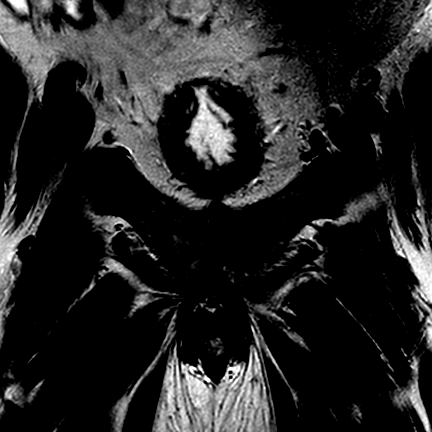

[Series 501: t2w ax · axial · 3.0mm · 0.38mm/px · 1 of 32 slices shown]
[im 1/32]
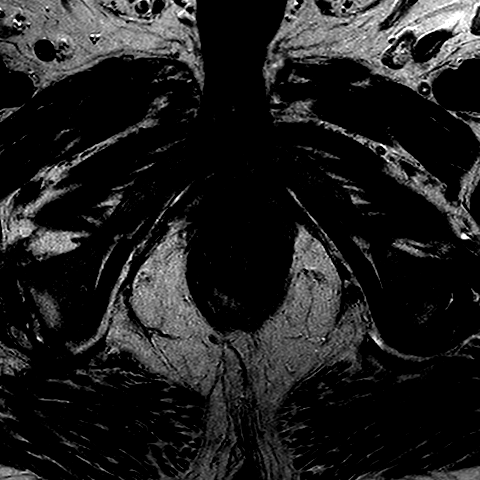

[Series 601: new-dwi_3b* 3mm* · axial · 3.0mm · 1.28mm/px · 1 of 64 slices shown]
[im 1/64]
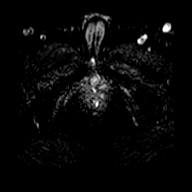

[Series 602: ADC · axial · 3.0mm · 1.28mm/px · 1 of 30 slices shown (1 of 2)]
[im 1/30]
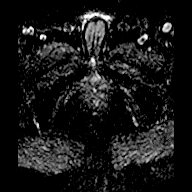

[Series 603: ADC · axial · 3.0mm · 1.28mm/px · 1 of 30 slices shown (2 of 2)]
[im 1/30]
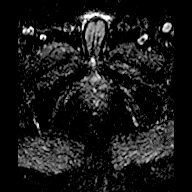

[Series 604: (id) · axial · 3.0mm · 1.28mm/px · 1 of 32 slices shown (1 of 3)]
[im 1/32]
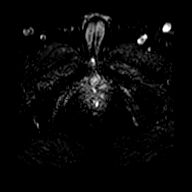

[Series 605: (id) · axial · 3.0mm · 1.28mm/px · 1 of 32 slices shown (2 of 3)]
[im 1/32]
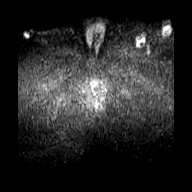

[Series 701: (id) · axial · 3.0mm · 1.28mm/px · 1 of 32 slices shown (3 of 3)]
[im 1/32]
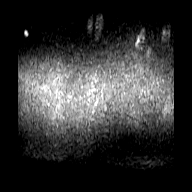

[Series 901: dyn 3mm*(ap) · axial · 3.0mm · 1.38mm/px · z∈[-108,-15]mm · 35 of 1440 slices shown]
[im 1/1440]
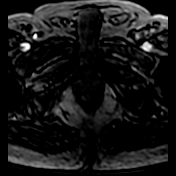
[im 43/1440]
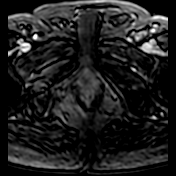
[im 85/1440]
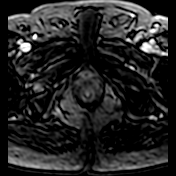
[im 127/1440]
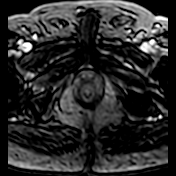
[im 170/1440]
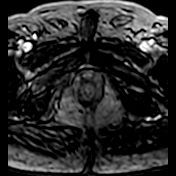
[im 212/1440]
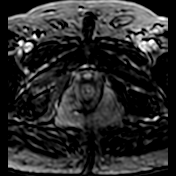
[im 254/1440]
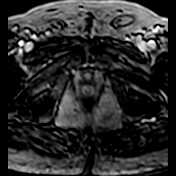
[im 297/1440]
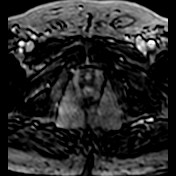
[im 339/1440]
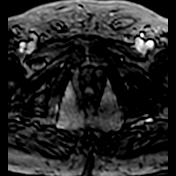
[im 381/1440]
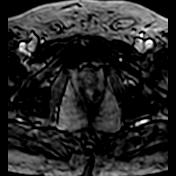
[im 424/1440]
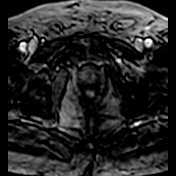
[im 466/1440]
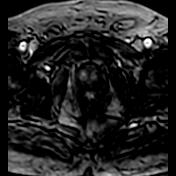
[im 508/1440]
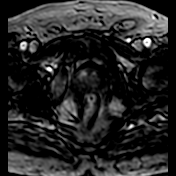
[im 551/1440]
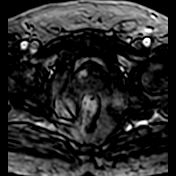
[im 593/1440]
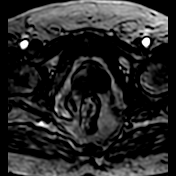
[im 635/1440]
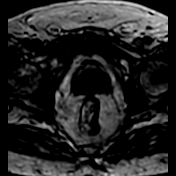
[im 678/1440]
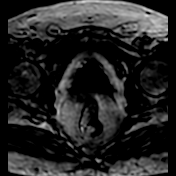
[im 720/1440]
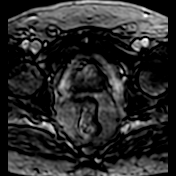
[im 762/1440]
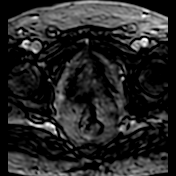
[im 805/1440]
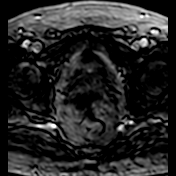
[im 847/1440]
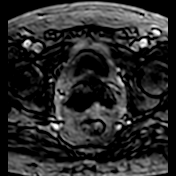
[im 889/1440]
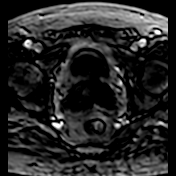
[im 932/1440]
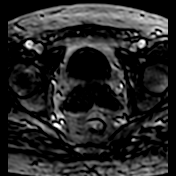
[im 974/1440]
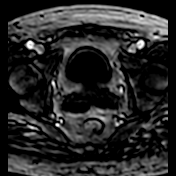
[im 1016/1440]
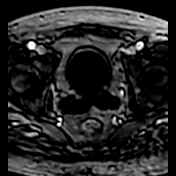
[im 1059/1440]
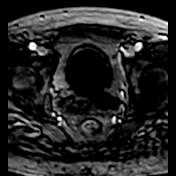
[im 1101/1440]
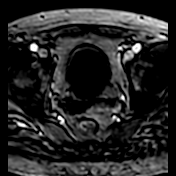
[im 1143/1440]
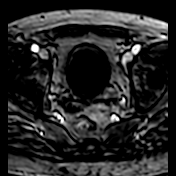
[im 1186/1440]
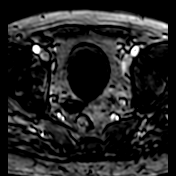
[im 1228/1440]
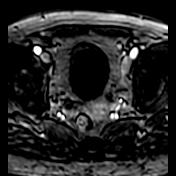
[im 1270/1440]
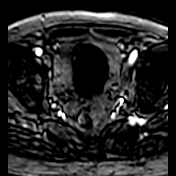
[im 1313/1440]
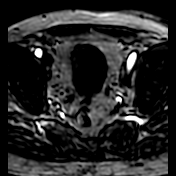
[im 1355/1440]
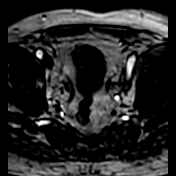
[im 1397/1440]
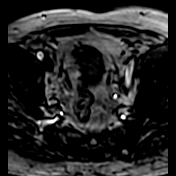
[im 1440/1440]
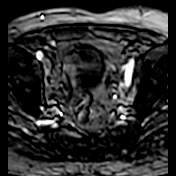

[Series 1001: DIXON · axial · 6.0mm · 0.49mm/px · 1 of 72 slices shown (1 of 2)]
[im 1/72]
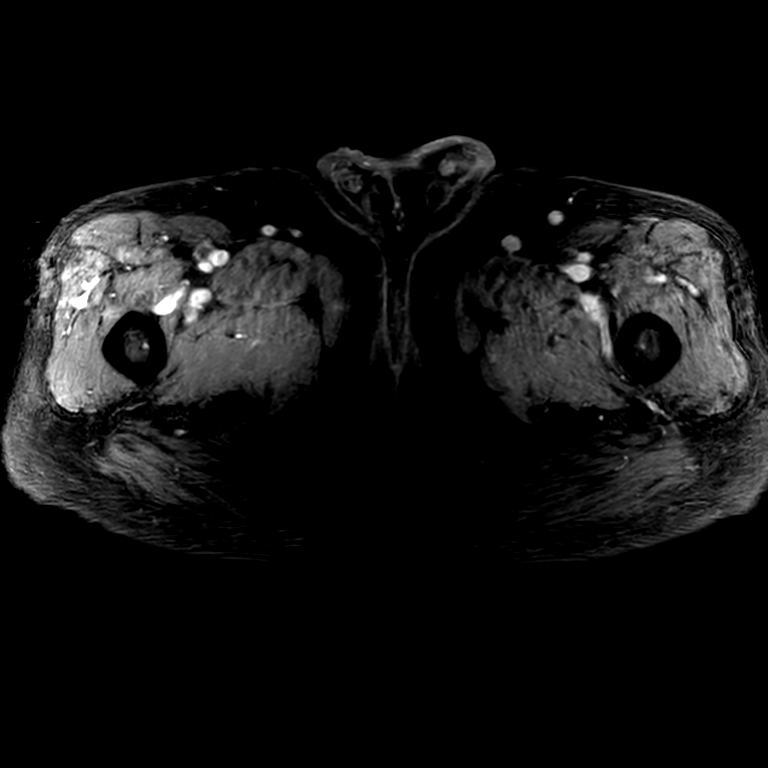

[Series 1002: DIXON · axial · 6.0mm · 0.49mm/px · 1 of 36 slices shown (2 of 2)]
[im 1/36]
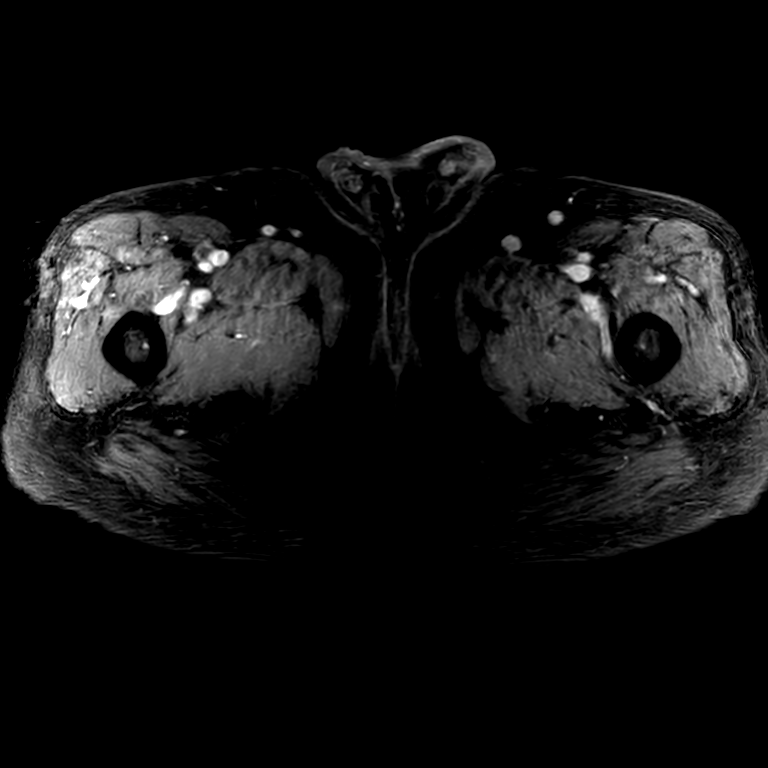

[48 of 48 positions shown; findings below may reference images not displayed]

FINDINGS: VOLUME: 40 cc 
CENTRAL GLAND: There is an asymmetric area of restricted diffusion involving the 
anterior right central gland mid gland level. This is decreased in signal on T2 
and shows significant restricted diffusion. On diffusion image 13 measures
cm. Asymmetric flow with washout is identified as well. 
PERIPHERAL ZONE: No areas of restricted diffusion identified. No suspicious 
lesion seen in the peripheral zone. 
PROSTATE CAPSULE: Intact 
MUSCLE SIDE WALLS: Normal in appearance. 
SEMINAL VESICLES: Normal in appearance. 
BLADDER: Thickened and trabeculated. 
LYMPHADENOPATHY: None 
BONES: No osseous lesions identified. 
ADDITIONAL FINDINGS: Degenerative changes with bilateral pars defect and 
subluxation of L5 on S1.
IMPRESSION: Findings concerning for central gland neoplasm anterior right central gland mid 
gland level. 
Degenerative changes with subluxation at L5-S1. 
(PI-RADS 4): High (clinically significant cancer is likely to be present).

## 8386-11-26 DEATH — deceased
